# Patient Record
Sex: Male | Born: 1980 | Race: Black or African American | Hispanic: No
Health system: Southern US, Community
[De-identification: ages and names within clinical notes are randomized; demographics above are authoritative.]

---

## 1998-04-28 ENCOUNTER — Emergency Department (HOSPITAL_COMMUNITY): Admission: EM | Admit: 1998-04-28 | Discharge: 1998-04-28 | Payer: Self-pay | Admitting: Emergency Medicine

## 2006-03-06 ENCOUNTER — Emergency Department (HOSPITAL_COMMUNITY): Admission: EM | Admit: 2006-03-06 | Discharge: 2006-03-06 | Payer: Self-pay | Admitting: *Deleted

## 2006-03-15 ENCOUNTER — Emergency Department (HOSPITAL_COMMUNITY): Admission: EM | Admit: 2006-03-15 | Discharge: 2006-03-15 | Payer: Self-pay | Admitting: Emergency Medicine

## 2010-07-01 ENCOUNTER — Emergency Department (HOSPITAL_COMMUNITY): Admission: EM | Admit: 2010-07-01 | Discharge: 2010-07-01 | Payer: Self-pay | Admitting: Emergency Medicine

## 2011-11-06 ENCOUNTER — Emergency Department (HOSPITAL_COMMUNITY)
Admission: EM | Admit: 2011-11-06 | Discharge: 2011-11-06 | Disposition: A | Discharge status: Home / Self Care | Payer: Self-pay | Attending: Emergency Medicine | Admitting: Emergency Medicine

## 2011-11-06 ENCOUNTER — Encounter: Payer: Self-pay | Admitting: Emergency Medicine

## 2011-11-06 DIAGNOSIS — R112 Nausea with vomiting, unspecified: Secondary | ICD-10-CM | POA: Insufficient documentation

## 2011-11-06 DIAGNOSIS — F1010 Alcohol abuse, uncomplicated: Secondary | ICD-10-CM | POA: Insufficient documentation

## 2011-11-06 DIAGNOSIS — F10929 Alcohol use, unspecified with intoxication, unspecified: Secondary | ICD-10-CM

## 2011-11-06 MED ORDER — ONDANSETRON HCL 4 MG/2ML IJ SOLN
4.0000 mg | Freq: Once | INTRAMUSCULAR | Status: AC
  Administered 2011-11-06: 02:00:00 via INTRAVENOUS

## 2011-11-06 MED ORDER — PROMETHAZINE HCL 25 MG PO TABS: 25.0000 mg | ORAL_TABLET | Freq: Four times a day (QID) | ORAL | Status: AC | PRN

## 2011-11-06 MED ORDER — SODIUM CHLORIDE 0.9 % IV BOLUS (SEPSIS)
1000.0000 mL | Freq: Once | INTRAVENOUS | Status: AC
  Administered 2011-11-06: 1000 mL via INTRAVENOUS

## 2011-11-06 MED ORDER — ONDANSETRON HCL 4 MG/2ML IJ SOLN
INTRAMUSCULAR | Status: AC
  Filled 2011-11-06: qty 2

## 2011-11-06 NOTE — ED Notes (Signed)
See arrival info. 

## 2011-11-06 NOTE — ED Provider Notes (Signed)
History     CSN: [id number] Arrival date & time: 11/06/2011  1:54 AM   First MD Initiated Contact with Patient 11/06/11 [phone]      Chief Complaint  Patient presents with  . Alcohol Intoxication    HPI: Patient is a 30 y.o. male presenting with intoxication. The history is provided by the patient. No language interpreter was used.  Alcohol Intoxication This is a new problem. The current episode started today. The problem has been resolved. Associated symptoms include nausea and vomiting. He has tried nothing for the symptoms.  Alcohol Intoxication This is a new problem. The current episode started today. The problem has been resolved. He has tried nothing for the symptoms.  Pt admits to drinking mx shots of liquor tonight to celebrate his B-Day. He then began to "feel funny" and began to have N/V. Brought in by EMS for same. History reviewed. No pertinent past medical history.  History reviewed. No pertinent past surgical history.  History reviewed. No pertinent family history.  History  Substance Use Topics  . Smoking status: Current Everyday Smoker  . Smokeless tobacco: Not on file  . Alcohol Use: Yes     consumed tonight      Review of Systems  Constitutional: Negative.   HENT: Negative.   Eyes: Negative.   Respiratory: Negative.   Cardiovascular: Negative.   Gastrointestinal: Positive for nausea and vomiting.  Genitourinary: Negative.   Musculoskeletal: Negative.   Skin: Negative.   Neurological: Negative.   Hematological: Negative.   Psychiatric/Behavioral: Negative.     Allergies  Review of patient's allergies indicates no known allergies.  Home Medications  No current outpatient prescriptions on file.  BP 94/68  Pulse 60  Temp(Src) 97.6 F (36.4 C) (Oral)  Resp 18  SpO2 100%  Physical Exam  Constitutional: He is oriented to person, place, and time. He appears well-developed and well-nourished.  HENT:  Head: Normocephalic and atraumatic.  Eyes:  Pupils are equal, round, and reactive to light.  Neck: Neck supple.  Cardiovascular: Normal rate and regular rhythm.   Pulmonary/Chest: Effort normal and breath sounds normal.  Abdominal: Soft. Bowel sounds are normal.  Neurological: He is alert and oriented to person, place, and time.  Skin: Skin is warm and dry.  Psychiatric: He has a normal mood and affect.    ED Course  Procedures Pt feeling better after 2L NS and Zofran. No further vomiting. Mother at bedside and has agreed to take pt home and be responsible for his safety. Pt agreeable w/ plan  Labs Reviewed - No data to display No results found.   No diagnosis found.    MDM  N/V associated w/ alcohol intoxication.No further n/v. Pt feeling better. Mother here to take pt home. Will d/c home w/ medication for nausea if needed.   Medical screening examination/treatment/procedure(s) were performed by non-physician practitioner and as supervising physician I was immediately available for consultation/collaboration.      Roma Kayser Schorr, NP 11/06/11 [phone]  Sunnie Nielsen, MD 11/06/11 [phone]

## 2011-11-06 NOTE — ED Notes (Signed)
Patient stable and being discharged home with family.

## 2015-05-12 ENCOUNTER — Emergency Department (HOSPITAL_COMMUNITY)
Admission: EM | Admit: 2015-05-12 | Discharge: 2015-05-12 | Disposition: A | Discharge status: Home / Self Care | Payer: BLUE CROSS/BLUE SHIELD | Attending: Emergency Medicine | Admitting: Emergency Medicine

## 2015-05-12 ENCOUNTER — Encounter (HOSPITAL_COMMUNITY): Payer: Self-pay | Admitting: Emergency Medicine

## 2015-05-12 DIAGNOSIS — Z720 Tobacco use: Secondary | ICD-10-CM | POA: Diagnosis not present

## 2015-05-12 DIAGNOSIS — K0889 Other specified disorders of teeth and supporting structures: Secondary | ICD-10-CM

## 2015-05-12 DIAGNOSIS — K088 Other specified disorders of teeth and supporting structures: Secondary | ICD-10-CM | POA: Insufficient documentation

## 2015-05-12 MED ORDER — IBUPROFEN 800 MG PO TABS: 800.0000 mg | ORAL_TABLET | Freq: Three times a day (TID) | ORAL | Status: DC

## 2015-05-12 MED ORDER — AMOXICILLIN 500 MG PO CAPS: 500.0000 mg | ORAL_CAPSULE | Freq: Three times a day (TID) | ORAL | Status: DC

## 2015-05-12 NOTE — ED Notes (Signed)
Pt states that he woke up with right lower back knot in his mouth that is very painful.

## 2015-05-12 NOTE — ED Provider Notes (Signed)
CSN: [id number]     Arrival date & time 05/12/15  0732 History   First MD Initiated Contact with Patient 05/12/15 [phone]     Chief Complaint  Patient presents with  . Dental Pain     (Consider location/radiation/quality/duration/timing/severity/associated sxs/prior Treatment) Patient is a 34 y.o. male presenting with tooth pain. The history is provided by the patient.  Dental Pain Location:  Lower Lower teeth location:  32/RL 3rd molar Quality:  Aching and throbbing Severity:  Moderate Duration:  2 hours Timing:  Intermittent Progression:  Worsening Chronicity:  New Context: filling still in place, not recent dental surgery and not trauma   Relieved by:  Nothing Worsened by:  Nothing tried Ineffective treatments:  None tried Associated symptoms: no fever, no headaches, no neck pain and no oral bleeding   Risk factors: smoking   Risk factors: no immunosuppression     History reviewed. No pertinent past medical history. History reviewed. No pertinent past surgical history. No family history on file. History  Substance Use Topics  . Smoking status: Current Every Day Smoker  . Smokeless tobacco: Not on file  . Alcohol Use: Yes     Comment: consumed tonight    Review of Systems  Constitutional: Negative for fever and activity change.       All ROS Neg except as noted in HPI  HENT: Positive for dental problem. Negative for nosebleeds.   Eyes: Negative for photophobia and discharge.  Respiratory: Negative for cough, shortness of breath and wheezing.   Cardiovascular: Negative for chest pain and palpitations.  Gastrointestinal: Negative for abdominal pain and blood in stool.  Genitourinary: Negative for dysuria, frequency and hematuria.  Musculoskeletal: Negative for back pain, arthralgias and neck pain.  Skin: Negative.   Neurological: Negative for dizziness, seizures, speech difficulty and headaches.  Psychiatric/Behavioral: Negative for hallucinations and confusion.       Allergies  Review of patient's allergies indicates no known allergies.  Home Medications   Prior to Admission medications   Not on File   BP 115/78 mmHg  Pulse 64  Temp(Src) 98.1 F (36.7 C) (Oral)  Resp 16  SpO2 100% Physical Exam  Constitutional: He is oriented to person, place, and time. He appears well-developed and well-nourished.  Non-toxic appearance.  HENT:  Head: Normocephalic.  Right Ear: Tympanic membrane and external ear normal.  Left Ear: Tympanic membrane and external ear normal.  Mouth/Throat: Abnormal dentition.  There is swelling just behind the third molar on the lower right jaw area. There is no abscess. There is some swelling about the gum on. There appears to be some abnormality of abruption of the third molar. The airway is patent. There is no swelling under the tongue.  Eyes: EOM and lids are normal. Pupils are equal, round, and reactive to light.  Neck: Normal range of motion. Neck supple. Carotid bruit is not present.  Cardiovascular: Normal rate, regular rhythm, normal heart sounds, intact distal pulses and normal pulses.   Pulmonary/Chest: Breath sounds normal. No respiratory distress.  Abdominal: Soft. Bowel sounds are normal. There is no tenderness. There is no guarding.  Musculoskeletal: Normal range of motion.  Lymphadenopathy:       Head (right side): No submandibular adenopathy present.       Head (left side): No submandibular adenopathy present.    He has no cervical adenopathy.  Neurological: He is alert and oriented to person, place, and time. He has normal strength. No cranial nerve deficit or sensory deficit.  Skin: Skin  is warm and dry.  Psychiatric: He has a normal mood and affect. His speech is normal.  Nursing note and vitals reviewed.   ED Course  Procedures (including critical care time) Labs Review Labs Reviewed - No data to display  Imaging Review No results found.   EKG Interpretation None      MDM  Vital  signs are well within normal limits. Pulse oximetry is 100% on room air. Within normal limits by my interpretation. The patient has an abnormal interruption of the right lower third molar with some swelling about the gum on. The patient will be treated with amoxicillin and 800 mg ibuprofen. I strongly encouraged patient to see a dentist as soon as possible. He is also advised to see his primary physician or return to the emergency department if any changes, problems, or concerns.    Final diagnoses:  None    **I have reviewed nursing notes, vital signs, and all appropriate lab and imaging results for this patient.Ivery Quale*    Terrie Grajales, PA-C 05/12/15 [phone]  Toy CookeyMegan Docherty, MD 05/13/15 [phone]

## 2015-05-12 NOTE — Discharge Instructions (Signed)
Saltwater swishes maybe helpful. It is important that she see a dentist as sone as possible, as does seem to be some complication with the eruption of your third molar (wisdom tooth). Please use Amoxil 3 times daily with food. Please use ibuprofen 800 mg 3 times daily with food. Dental Pain A tooth ache may be caused by cavities (tooth decay). Cavities expose the nerve of the tooth to air and hot or cold temperatures. It may come from an infection or abscess (also called a boil or furuncle) around your tooth. It is also often caused by dental caries (tooth decay). This causes the pain you are having. DIAGNOSIS  Your caregiver can diagnose this problem by exam. TREATMENT   If caused by an infection, it may be treated with medications which kill germs (antibiotics) and pain medications as prescribed by your caregiver. Take medications as directed.  Only take over-the-counter or prescription medicines for pain, discomfort, or fever as directed by your caregiver.  Whether the tooth ache today is caused by infection or dental disease, you should see your dentist as soon as possible for further care. SEEK MEDICAL CARE IF: The exam and treatment you received today has been provided on an emergency basis only. This is not a substitute for complete medical or dental care. If your problem worsens or new problems (symptoms) appear, and you are unable to meet with your dentist, call or return to this location. SEEK IMMEDIATE MEDICAL CARE IF:   You have a fever.  You develop redness and swelling of your face, jaw, or neck.  You are unable to open your mouth.  You have severe pain uncontrolled by pain medicine. MAKE SURE YOU:   Understand these instructions.  Will watch your condition.  Will get help right away if you are not doing well or get worse. Document Released: 12/13/2005 Document Revised: 03/06/2012 Document Reviewed: 07/31/2008 [facility] Patient Information 2015 [employer], [place]. This  information is not intended to replace advice given to you by your health care provider. Make sure you discuss any questions you have with your health care provider.

## 2016-06-09 ENCOUNTER — Emergency Department (HOSPITAL_COMMUNITY): Payer: Self-pay

## 2016-06-09 ENCOUNTER — Encounter (HOSPITAL_COMMUNITY): Payer: Self-pay | Admitting: Emergency Medicine

## 2016-06-09 ENCOUNTER — Emergency Department (HOSPITAL_COMMUNITY)
Admission: EM | Admit: 2016-06-09 | Discharge: 2016-06-09 | Disposition: A | Discharge status: Home / Self Care | Payer: Self-pay | Attending: Emergency Medicine | Admitting: Emergency Medicine

## 2016-06-09 DIAGNOSIS — F17200 Nicotine dependence, unspecified, uncomplicated: Secondary | ICD-10-CM | POA: Insufficient documentation

## 2016-06-09 DIAGNOSIS — Z79899 Other long term (current) drug therapy: Secondary | ICD-10-CM | POA: Insufficient documentation

## 2016-06-09 DIAGNOSIS — R1032 Left lower quadrant pain: Secondary | ICD-10-CM | POA: Insufficient documentation

## 2016-06-09 DIAGNOSIS — R079 Chest pain, unspecified: Secondary | ICD-10-CM | POA: Insufficient documentation

## 2016-06-09 DIAGNOSIS — R51 Headache: Secondary | ICD-10-CM | POA: Insufficient documentation

## 2016-06-09 DIAGNOSIS — R1084 Generalized abdominal pain: Secondary | ICD-10-CM

## 2016-06-09 DIAGNOSIS — R1012 Left upper quadrant pain: Secondary | ICD-10-CM | POA: Insufficient documentation

## 2016-06-09 DIAGNOSIS — Z792 Long term (current) use of antibiotics: Secondary | ICD-10-CM | POA: Insufficient documentation

## 2016-06-09 LAB — CBC
HCT: 49.6 % (ref 39.0–52.0)
Hemoglobin: 16.9 g/dL (ref 13.0–17.0)
MCH: 28.3 pg (ref 26.0–34.0)
MCHC: 34.1 g/dL (ref 30.0–36.0)
MCV: 83.1 fL (ref 78.0–100.0)
Platelets: 254 10*3/uL (ref 150–400)
RBC: 5.97 MIL/uL — ABNORMAL HIGH (ref 4.22–5.81)
RDW: 13 % (ref 11.5–15.5)
WBC: 9.6 10*3/uL (ref 4.0–10.5)

## 2016-06-09 LAB — BASIC METABOLIC PANEL
Anion gap: 6 (ref 5–15)
BUN: 11 mg/dL (ref 6–20)
CO2: 29 mmol/L (ref 22–32)
Calcium: 9.2 mg/dL (ref 8.9–10.3)
Chloride: 106 mmol/L (ref 101–111)
Creatinine, Ser: 1.36 mg/dL — ABNORMAL HIGH (ref 0.61–1.24)
GFR calc Af Amer: >60 mL/min (ref >60)
GFR calc non Af Amer: >60 mL/min (ref >60)
Glucose, Bld: 91 mg/dL (ref 65–99)
Potassium: 4.1 mmol/L (ref 3.5–5.1)
Sodium: 141 mmol/L (ref 135–145)

## 2016-06-09 LAB — I-STAT TROPONIN, ED: Troponin i, poc: 0.01 ng/mL (ref 0.00–0.08)

## 2016-06-09 LAB — LIPASE, BLOOD: Lipase: 28 U/L (ref 11–51)

## 2016-06-09 MED ORDER — SODIUM CHLORIDE 0.9 % IV BOLUS (SEPSIS)
1000.0000 mL | Freq: Once | INTRAVENOUS | Status: AC
  Administered 2016-06-09: 1000 mL via INTRAVENOUS

## 2016-06-09 MED ORDER — FAMOTIDINE 20 MG PO TABS: 20.0000 mg | ORAL_TABLET | Freq: Two times a day (BID) | ORAL | Status: DC

## 2016-06-09 MED ORDER — FAMOTIDINE IN NACL 20-0.9 MG/50ML-% IV SOLN
20.0000 mg | Freq: Once | INTRAVENOUS | Status: AC
  Administered 2016-06-09: 20 mg via INTRAVENOUS
  Filled 2016-06-09: qty 50

## 2016-06-09 NOTE — ED Provider Notes (Signed)
CSN: [id number]     Arrival date & time 06/09/16  1227 History   First MD Initiated Contact with Patient 06/09/16 1555     Chief Complaint  Patient presents with  . Abdominal Pain  . Chest Pain  . Headache   PT SAID THAT HE HAS HAD ABD PAIN FOR THE PAST FEW WEEKS.  HE DENIES N/V/D OR FEVER.  THE PT TOLD THE NURSE THAT HE HAS HAD CP AND URI SX FOR THE PAST FEW DAYS, BUT DOES NOT MENTION THOSE TO ME.  PT DOES NOT HAVE PCP, SO HAS NOT SEEN THE DR FOR HIS SX.  PT HAS NOT TAKEN ANY OTC MEDS FOR SX.  (Consider location/radiation/quality/duration/timing/severity/associated sxs/prior Treatment) Patient is a 35 y.o. male presenting with abdominal pain, chest pain, and headaches. The history is provided by the patient.  Abdominal Pain Pain location:  LUQ and LLQ Pain quality: sharp   Pain radiates to:  Does not radiate Pain severity:  Mild Onset quality:  Gradual Timing:  Constant Progression:  Unchanged Chronicity:  New Relieved by:  Nothing Associated symptoms: chest pain   Chest Pain Associated symptoms: abdominal pain and headache   Headache Associated symptoms: abdominal pain     History reviewed. No pertinent past medical history. No past surgical history on file. No family history on file. Social History  Substance Use Topics  . Smoking status: Current Every Day Smoker  . Smokeless tobacco: None  . Alcohol Use: Yes     Comment: consumed tonight    Review of Systems  Cardiovascular: Positive for chest pain.  Gastrointestinal: Positive for abdominal pain.  Neurological: Positive for headaches.  All other systems reviewed and are negative.     Allergies  Review of patient's allergies indicates no known allergies.  Home Medications   Prior to Admission medications   Medication Sig Start Date End Date Taking? Authorizing Provider  Phenylephrine-DM-GG-APAP (TYLENOL COLD/FLU SEVERE) 5-10-200-325 MG TABS Take 1 tablet by mouth daily as needed (cold symptoms).   Yes  Historical Provider, MD  Phenylephrine-Pheniramine-DM [facility](THERAFLU COLD & COUGH PO) Take 1 packet by mouth daily as needed (cold symptoms).   Yes Historical Provider, MD  amoxicillin (AMOXIL) 500 MG capsule Take 1 capsule (500 mg total) by mouth 3 (three) times daily. Patient not taking: Reported on 06/09/2016 05/12/15   Ivery QualeHobson Bryant, PA-C  famotidine (PEPCID) 20 MG tablet Take 1 tablet (20 mg total) by mouth 2 (two) times daily. 06/09/16   Jacalyn LefevreJulie Jamisen Duerson, MD  ibuprofen (ADVIL,MOTRIN) 800 MG tablet Take 1 tablet (800 mg total) by mouth 3 (three) times daily. Patient not taking: Reported on 06/09/2016 05/12/15   Ivery QualeHobson Bryant, PA-C   BP 127/89 mmHg  Pulse 66  Temp(Src) 98.5 F (36.9 C) (Oral)  Resp 16  SpO2 100% Physical Exam  Constitutional: He is oriented to person, place, and time. He appears well-developed and well-nourished.  HENT:  Head: Normocephalic and atraumatic.  Right Ear: External ear normal.  Left Ear: External ear normal.  Nose: Nose normal.  Mouth/Throat: Oropharynx is clear and moist.  Eyes: Conjunctivae and EOM are normal. Pupils are equal, round, and reactive to light.  Neck: Normal range of motion. Neck supple.  Cardiovascular: Normal rate, regular rhythm, normal heart sounds and intact distal pulses.   Pulmonary/Chest: Effort normal and breath sounds normal.  Abdominal: Soft. Bowel sounds are normal. There is tenderness in the left lower quadrant.  Musculoskeletal: Normal range of motion.  Neurological: He is alert and oriented to person, place,  and time.  Skin: Skin is warm and dry.  Psychiatric: He has a normal mood and affect. His behavior is normal. Judgment and thought content normal.  Nursing note and vitals reviewed.   ED Course  Procedures (including critical care time) Labs Review Labs Reviewed  BASIC METABOLIC PANEL - Abnormal; Notable for the following:    Creatinine, Ser 1.36 (*)    All other components within normal limits  CBC - Abnormal; Notable  for the following:    RBC 5.97 (*)    All other components within normal limits  LIPASE, BLOOD  I-STAT TROPOININ, ED    Imaging Review Dg Chest 2 View  06/09/2016  CLINICAL DATA:  Chest pain for 1 month. EXAM: CHEST  2 VIEW COMPARISON:  None. FINDINGS: The heart size and mediastinal contours are within normal limits. Both lungs are clear. The visualized skeletal structures are unremarkable. IMPRESSION: No active cardiopulmonary disease. Electronically Signed   By: Elsie Stain M.D.   On: 06/09/2016 13:45   Dg Abd 1 View  06/09/2016  CLINICAL DATA:  abd pain greater on left x 2 weeks - pt states normal bowel movements EXAM: ABDOMEN - 1 VIEW COMPARISON:  None. FINDINGS: Normal bowel gas pattern. No abnormal abdominal calcifications. Regional bones unremarkable. IMPRESSION: Negative. Electronically Signed   By: Corlis Leak M.D.   On: 06/09/2016 16:45   I have personally reviewed and evaluated these images and lab results as part of my medical decision-making.   EKG Interpretation   Date/Time:  Wednesday June 09 2016 17:04:35 EDT Ventricular Rate:  64 PR Interval:  146 QRS Duration: 102 QT Interval:  379 QTC Calculation: 391 R Axis:   94 Text Interpretation:  Sinus rhythm Left posterior fascicular block ST  elev, probable normal early repol pattern Confirmed by Zakry Caso MD, [facility]  ([id number]) on 06/09/2016 5:14:32 PM      MDM  PT'S SX HAVE BEEN GOING ON FOR WEEKS.  NO FEVER, NO ELEVATED WBC.  EXAM IS BENIGN.  NO NEED FOR EMERGENT CT ABD/PELVIS.  PT GIVEN THE NUMBER OF GI AND INSTR TO F/U WITH THEM.  PT KNOWS TO RETURN HERE IF SX WORSEN.  Final diagnoses:  Generalized abdominal pain       Jacalyn Lefevre, MD 06/09/16 1724

## 2016-06-09 NOTE — Discharge Instructions (Signed)

## 2016-06-09 NOTE — ED Notes (Signed)
Pt states that he has been having "flu like symptoms", however, pt describes gen abd pain, CP, dizziness, headache, and URI sx since Monday.

## 2016-08-01 ENCOUNTER — Emergency Department (HOSPITAL_COMMUNITY)
Admission: EM | Admit: 2016-08-01 | Discharge: 2016-08-01 | Disposition: A | Discharge status: Home / Self Care | Payer: BLUE CROSS/BLUE SHIELD | Attending: Emergency Medicine | Admitting: Emergency Medicine

## 2016-08-01 DIAGNOSIS — Z791 Long term (current) use of non-steroidal anti-inflammatories (NSAID): Secondary | ICD-10-CM | POA: Insufficient documentation

## 2016-08-01 DIAGNOSIS — F17200 Nicotine dependence, unspecified, uncomplicated: Secondary | ICD-10-CM | POA: Insufficient documentation

## 2016-08-01 DIAGNOSIS — J40 Bronchitis, not specified as acute or chronic: Secondary | ICD-10-CM | POA: Insufficient documentation

## 2016-08-01 MED ORDER — AZITHROMYCIN 250 MG PO TABS: 250.0000 mg | ORAL_TABLET | Freq: Every day | ORAL | 0 refills | Status: DC

## 2016-08-01 NOTE — ED Triage Notes (Signed)
Pt states that he has had fever, runny nose, cough and his ears have been stopping up since last Thursday. Not febrile now. Took alka selzer plus cold at home. Alert and oriented.

## 2016-08-01 NOTE — ED Provider Notes (Signed)
[facility]-EMERGENCY DEPT Provider Note   CSN: [id number] Arrival date & time: 08/01/16  2041  First Provider Contact:  First MD Initiated Contact with Patient 08/01/16 2338      By signing my name below, I, Vista Mink, attest that this documentation has been prepared under the direction and in the presence of Messiah Ahr PA-C.  Electronically Signed: Vista Mink, ED Scribe. 08/02/16. 12:02 AM.   History   Chief Complaint Chief Complaint  Patient presents with  . URI    HPI HPI Comments: Christopher Rice is a 35 y.o. male who presents to the Emergency Department complaining of gradually worsening nasal congestion with associated chills and cough onset four days ago. Pt reports rhinorrhea and productive cough with phlegm. Pt further reports subjective fever since the onset of symptoms; not currently febrile on arrival to ED. Pt has taken Alka Seltzer Cold and Flu with no relief of symptoms. Pt states that his daughter was sick the week prior to onset of his symptoms. Reports no exacerbating or alleviating factors.     The history is provided by the patient. No language interpreter was used.    No past medical history on file.  There are no active problems to display for this patient.   No past surgical history on file.     Home Medications    Prior to Admission medications   Medication Sig Start Date End Date Taking? Authorizing Provider  amoxicillin (AMOXIL) 500 MG capsule Take 1 capsule (500 mg total) by mouth 3 (three) times daily. Patient not taking: Reported on 06/09/2016 05/12/15   Ivery Quale, PA-C  famotidine (PEPCID) 20 MG tablet Take 1 tablet (20 mg total) by mouth 2 (two) times daily. 06/09/16   Jacalyn Lefevre, MD  ibuprofen (ADVIL,MOTRIN) 800 MG tablet Take 1 tablet (800 mg total) by mouth 3 (three) times daily. Patient not taking: Reported on 06/09/2016 05/12/15   Ivery Quale, PA-C  Phenylephrine-DM-GG-APAP (TYLENOL COLD/FLU SEVERE) 5-10-200-325 MG TABS Take 1  tablet by mouth daily as needed (cold symptoms).    Historical Provider, MD  Phenylephrine-Pheniramine-DM [facility] COLD & COUGH PO) Take 1 packet by mouth daily as needed (cold symptoms).    Historical Provider, MD    Family History No family history on file.  Social History Social History  Substance Use Topics  . Smoking status: Current Every Day Smoker  . Smokeless tobacco: Not on file  . Alcohol use Yes     Comment: consumed tonight     Allergies   Review of patient's allergies indicates no known allergies.   Review of Systems Review of Systems  Constitutional: Positive for chills and fever (subjective).  HENT: Positive for congestion and rhinorrhea.   Respiratory: Positive for cough.   All other systems reviewed and are negative.    Physical Exam Updated Vital Signs BP 139/89 (BP Location: Right Arm)   Pulse 72   Temp 98.1 F (36.7 C) (Oral)   Resp 18   Ht 6' (1.829 m)   Wt 200 lb (90.7 kg)   SpO2 100%   BMI 27.12 kg/m   Physical Exam  Constitutional: He is oriented to person, place, and time. He appears well-developed and well-nourished. No distress.  HENT:  Head: Normocephalic and atraumatic.  Mouth/Throat: Oropharynx is clear and moist.  Bilateral ears clear. Nasal mucosa swollen and erythematous. Oropharynx benign. Anterior cervical lymphadenopathy.  Neck: Normal range of motion.  Cardiovascular: Normal rate and regular rhythm.   No murmur heard. Pulmonary/Chest: Effort normal.  He has no wheezes. He has no rales.  Diffuse, mild rhonchi.  Abdominal: There is no tenderness.  Musculoskeletal: Normal range of motion.  Neurological: He is alert and oriented to person, place, and time.  Skin: Skin is warm and dry. He is not diaphoretic.  Psychiatric: He has a normal mood and affect. Judgment normal.  Nursing note and vitals reviewed.    ED Treatments / Results  DIAGNOSTIC STUDIES: Oxygen Saturation is 100% on RA, normal by my  interpretation.  COORDINATION OF CARE: 11:41 PM-Will order ZPack. Discussed treatment plan with pt at bedside and pt agreed to plan.   Labs (all labs ordered are listed, but only abnormal results are displayed) Labs Reviewed - No data to display  EKG  EKG Interpretation None       Radiology No results found.  Procedures Procedures (including critical care time)  Medications Ordered in ED Medications - No data to display   Initial Impression / Assessment and Plan / ED Course  I have reviewed the triage vital signs and the nursing notes.  Pertinent labs & imaging results that were available during my care of the patient were reviewed by me and considered in my medical decision making (see chart for details).  Clinical Course    Patient with complaint of URI symptoms including cough. Exam supports viral vs bronchitis/sinusitis. Will cover with Z-pack and encourage PCP follow up.  Final Clinical Impressions(s) / ED Diagnoses   Final diagnoses:  None  1. bronchitis  New Prescriptions New Prescriptions   No medications on file  I personally performed the services described in this documentation, which was scribed in my presence. The recorded information has been reviewed and is accurate.     Elpidio AnisShari Makiya Jeune, PA-C 08/07/16 2023    Lyndal Pulleyaniel Knott, MD 08/12/16 [phone]

## 2017-06-03 ENCOUNTER — Encounter (HOSPITAL_COMMUNITY): Payer: Self-pay | Admitting: Emergency Medicine

## 2017-06-03 ENCOUNTER — Emergency Department (HOSPITAL_COMMUNITY)
Admission: EM | Admit: 2017-06-03 | Discharge: 2017-06-03 | Disposition: A | Discharge status: Home / Self Care | Payer: BLUE CROSS/BLUE SHIELD | Attending: Emergency Medicine | Admitting: Emergency Medicine

## 2017-06-03 DIAGNOSIS — F17200 Nicotine dependence, unspecified, uncomplicated: Secondary | ICD-10-CM | POA: Insufficient documentation

## 2017-06-03 DIAGNOSIS — Y939 Activity, unspecified: Secondary | ICD-10-CM | POA: Insufficient documentation

## 2017-06-03 DIAGNOSIS — M545 Low back pain, unspecified: Secondary | ICD-10-CM

## 2017-06-03 DIAGNOSIS — X500XXA Overexertion from strenuous movement or load, initial encounter: Secondary | ICD-10-CM | POA: Insufficient documentation

## 2017-06-03 DIAGNOSIS — Y999 Unspecified external cause status: Secondary | ICD-10-CM | POA: Insufficient documentation

## 2017-06-03 DIAGNOSIS — Y92007 Garden or yard of unspecified non-institutional (private) residence as the place of occurrence of the external cause: Secondary | ICD-10-CM | POA: Insufficient documentation

## 2017-06-03 DIAGNOSIS — Z79899 Other long term (current) drug therapy: Secondary | ICD-10-CM | POA: Insufficient documentation

## 2017-06-03 MED ORDER — NAPROXEN 500 MG PO TABS: 500.0000 mg | ORAL_TABLET | Freq: Two times a day (BID) | ORAL | 0 refills | Status: DC

## 2017-06-03 MED ORDER — OXYCODONE-ACETAMINOPHEN 5-325 MG PO TABS
1.0000 | ORAL_TABLET | ORAL | 0 refills | Status: DC | PRN
Start: 2017-06-03 — End: 2023-04-23

## 2017-06-03 MED ORDER — CYCLOBENZAPRINE HCL 10 MG PO TABS
10.0000 mg | ORAL_TABLET | Freq: Once | ORAL | Status: AC
Start: 2017-06-03 — End: 2017-06-03
  Administered 2017-06-03: 10 mg via ORAL
  Filled 2017-06-03: qty 1

## 2017-06-03 MED ORDER — ORPHENADRINE CITRATE ER 100 MG PO TB12: 100.0000 mg | ORAL_TABLET | Freq: Two times a day (BID) | ORAL | 0 refills | Status: DC

## 2017-06-03 MED ORDER — IBUPROFEN 800 MG PO TABS
800.0000 mg | ORAL_TABLET | Freq: Once | ORAL | Status: AC
  Administered 2017-06-03: 800 mg via ORAL
  Filled 2017-06-03: qty 1

## 2017-06-03 NOTE — ED Provider Notes (Signed)
[facility]-EMERGENCY DEPT Provider Note   CSN: [id number] Arrival date & time: 06/03/17  0226   By signing my name below, I, Clarisse GougeXavier Herndon, attest that this documentation has been prepared under the direction and in the presence of Dione BoozeGlick, Ayaz Sondgeroth, MD. Electronically signed, Clarisse GougeXavier Herndon, ED Scribe. 06/03/17. 3:19 AM.   History   Chief Complaint Chief Complaint  Patient presents with  . Back Pain   The history is provided by the patient and medical records. No language interpreter was used.    Christopher Rice is a 36 y.o. male presenting to the Emergency Department with chief complaint of recurrent, worsening pain across the lower back x "a couple days". Pt describes 10/10, sharp, throbbing, burning pain in the low back that is worse with laying flat on his back. He states the pain radiates down both thighs with a tingling, pins & needle sensation to the lower extremities. He also notes mild relief to pain with hamstring stretching. Pt states he has taken tylenol, heat and ice without relief. No other modifying factors noted. He notes h/o similar, less severe symptoms that have been relieved by heat and muscle relaxants. He states he recently did more bending, lifting and twisting during yard work. No PCP noted for F/U. No weakness or numbness noted. No other complaints at this time.   History reviewed. No pertinent past medical history.  There are no active problems to display for this patient.   History reviewed. No pertinent surgical history.     Home Medications    Prior to Admission medications   Medication Sig Start Date End Date Taking? Authorizing Provider  amoxicillin (AMOXIL) 500 MG capsule Take 1 capsule (500 mg total) by mouth 3 (three) times daily. Patient not taking: Reported on 06/09/2016 05/12/15   Ivery QualeBryant, Hobson, PA-C  azithromycin (ZITHROMAX) 250 MG tablet Take 1 tablet (250 mg total) by mouth daily. Take first 2 tablets together, then 1 every day until finished. 08/01/16    Elpidio AnisUpstill, Shari, PA-C  famotidine (PEPCID) 20 MG tablet Take 1 tablet (20 mg total) by mouth 2 (two) times daily. 06/09/16   Jacalyn LefevreHaviland, Julie, MD  ibuprofen (ADVIL,MOTRIN) 800 MG tablet Take 1 tablet (800 mg total) by mouth 3 (three) times daily. Patient not taking: Reported on 06/09/2016 05/12/15   Ivery QualeBryant, Hobson, PA-C  Phenylephrine-DM-GG-APAP (TYLENOL COLD/FLU SEVERE) 5-10-200-325 MG TABS Take 1 tablet by mouth daily as needed (cold symptoms).    [provider]  Phenylephrine-Pheniramine-DM [facility](THERAFLU COLD & COUGH PO) Take 1 packet by mouth daily as needed (cold symptoms).    [provider]    Family History History reviewed. No pertinent family history.  Social History Social History  Substance Use Topics  . Smoking status: Current Every Day Smoker  . Smokeless tobacco: Never Used  . Alcohol use Yes     Comment: consumed tonight     Allergies   Patient has no known allergies.   Review of Systems Review of Systems  Musculoskeletal: Positive for back pain and myalgias.  Skin: Negative for color change and wound.  Neurological: Negative for weakness and numbness.  All other systems reviewed and are negative.    Physical Exam Updated Vital Signs BP 118/87 (BP Location: Left Arm)   Pulse 80   Temp 97.7 F (36.5 C) (Oral)   Resp 18   Ht 6' (1.829 m)   Wt 198 lb (89.8 kg)   SpO2 99%   BMI 26.85 kg/m   Physical Exam  Constitutional: He is  oriented to person, place, and time. He appears well-developed and well-nourished.  HENT:  Head: Normocephalic and atraumatic.  Eyes: EOM are normal. Pupils are equal, round, and reactive to light.  Neck: Normal range of motion. Neck supple. No JVD present.  Cardiovascular: Normal rate, regular rhythm and normal heart sounds.   No murmur heard. Pulmonary/Chest: Effort normal and breath sounds normal. He has no wheezes. He has no rales. He exhibits no tenderness.  Abdominal: Soft. Bowel sounds are normal. He exhibits  no distension and no mass. There is no tenderness.  Musculoskeletal: Normal range of motion. He exhibits tenderness. He exhibits no edema.  Mild tenderness lumbar spine. Moderate bilateral paralumbar spasms L > R. Straight leg test negative.  Lymphadenopathy:    He has no cervical adenopathy.  Neurological: He is alert and oriented to person, place, and time. No cranial nerve deficit. He exhibits normal muscle tone. Coordination normal.  Skin: Skin is warm and dry. No rash noted.  Psychiatric: He has a normal mood and affect. His behavior is normal. Judgment and thought content normal.  Nursing note and vitals reviewed.    ED Treatments / Results  DIAGNOSTIC STUDIES: Oxygen Saturation is 99% on RA, NL by my interpretation.    COORDINATION OF CARE: 3:16 AM-Discussed next steps with pt. Pt verbalized understanding and is agreeable with the plan. Will Rx medications and provide resources for primary care F/U.  Procedures Procedures (including critical care time)  Medications Ordered in ED Medications  ibuprofen (ADVIL,MOTRIN) tablet 800 mg (not administered)  cyclobenzaprine (FLEXERIL) tablet 10 mg (not administered)     Initial Impression / Assessment and Plan / ED Course  I have reviewed the triage vital signs and the nursing notes.   Acute low back pain. No red flags to suggest serious causes of back pain. This appears to be musculoskeletal in origin. No indications for imaging. Old records reviewed, and he has no relevant past visits. He is discharged with prescriptions for naproxen, orphenadrine, and oxycodone-acetaminophen.  Final Clinical Impressions(s) / ED Diagnoses   Final diagnoses:  Acute bilateral low back pain without sciatica    New Prescriptions New Prescriptions   NAPROXEN (NAPROSYN) 500 MG TABLET    Take 1 tablet (500 mg total) by mouth 2 (two) times daily.   ORPHENADRINE (NORFLEX) 100 MG TABLET    Take 1 tablet (100 mg total) by mouth 2 (two) times daily.     OXYCODONE-ACETAMINOPHEN (PERCOCET) 5-325 MG TABLET    Take 1 tablet by mouth every 4 (four) hours as needed for moderate pain.   I personally performed the services described in this documentation, which was scribed in my presence. The recorded information has been reviewed and is accurate.       Dione Booze, MD 06/03/17 [phone]

## 2017-06-03 NOTE — ED Triage Notes (Signed)
Patient complaining about lower back pain that has been there since Monday. Patient does not know how he hurt his back. Both of his legs hurt when he lay down. Patient states the pain is so bad he can not sleep.

## 2020-09-09 ENCOUNTER — Other Ambulatory Visit: Payer: Self-pay

## 2020-09-09 DIAGNOSIS — Y9389 Activity, other specified: Secondary | ICD-10-CM | POA: Insufficient documentation

## 2020-09-09 DIAGNOSIS — M79602 Pain in left arm: Secondary | ICD-10-CM | POA: Diagnosis not present

## 2020-09-09 DIAGNOSIS — F17200 Nicotine dependence, unspecified, uncomplicated: Secondary | ICD-10-CM | POA: Diagnosis not present

## 2020-09-09 DIAGNOSIS — Z23 Encounter for immunization: Secondary | ICD-10-CM | POA: Diagnosis not present

## 2020-09-09 DIAGNOSIS — M25512 Pain in left shoulder: Secondary | ICD-10-CM | POA: Diagnosis not present

## 2020-09-09 DIAGNOSIS — Y998 Other external cause status: Secondary | ICD-10-CM | POA: Diagnosis not present

## 2020-09-09 DIAGNOSIS — Y92410 Unspecified street and highway as the place of occurrence of the external cause: Secondary | ICD-10-CM | POA: Diagnosis not present

## 2020-09-09 DIAGNOSIS — Z79899 Other long term (current) drug therapy: Secondary | ICD-10-CM | POA: Diagnosis not present

## 2020-09-10 ENCOUNTER — Emergency Department (HOSPITAL_COMMUNITY)
Admission: EM | Admit: 2020-09-10 | Discharge: 2020-09-10 | Disposition: A | Discharge status: Home / Self Care | Payer: No Typology Code available for payment source | Attending: Emergency Medicine | Admitting: Emergency Medicine

## 2020-09-10 ENCOUNTER — Emergency Department (HOSPITAL_COMMUNITY): Payer: No Typology Code available for payment source

## 2020-09-10 ENCOUNTER — Other Ambulatory Visit: Payer: Self-pay

## 2020-09-10 LAB — COMPREHENSIVE METABOLIC PANEL
ALT: 47 U/L — ABNORMAL HIGH (ref 0–44)
AST: 41 U/L (ref 15–41)
Albumin: 4.8 g/dL (ref 3.5–5.0)
Alkaline Phosphatase: 67 U/L (ref 38–126)
Anion gap: 15 (ref 5–15)
BUN: 12 mg/dL (ref 6–20)
CO2: 22 mmol/L (ref 22–32)
Calcium: 9 mg/dL (ref 8.9–10.3)
Chloride: 105 mmol/L (ref 98–111)
Creatinine, Ser: 1.24 mg/dL (ref 0.61–1.24)
GFR calc Af Amer: >60 mL/min (ref >60)
GFR calc non Af Amer: >60 mL/min (ref >60)
Glucose, Bld: 92 mg/dL (ref 70–99)
Potassium: 3.7 mmol/L (ref 3.5–5.1)
Sodium: 142 mmol/L (ref 135–145)
Total Bilirubin: 2.6 mg/dL — ABNORMAL HIGH (ref 0.3–1.2)
Total Protein: 7.9 g/dL (ref 6.5–8.1)

## 2020-09-10 LAB — URINALYSIS, ROUTINE W REFLEX MICROSCOPIC
Bilirubin Urine: NEGATIVE
Glucose, UA: NEGATIVE mg/dL
Ketones, ur: 5 mg/dL — AB
Leukocytes,Ua: NEGATIVE
Nitrite: NEGATIVE
Protein, ur: NEGATIVE mg/dL
Specific Gravity, Urine: 1.017 (ref 1.005–1.030)
pH: 5 (ref 5.0–8.0)

## 2020-09-10 LAB — PROTIME-INR
INR: 1.1 (ref 0.8–1.2)
Prothrombin Time: 14.2 seconds (ref 11.4–15.2)

## 2020-09-10 LAB — LACTIC ACID, PLASMA: Lactic Acid, Venous: 2.2 mmol/L (ref 0.5–1.9)

## 2020-09-10 LAB — CBC
HCT: 54 % — ABNORMAL HIGH (ref 39.0–52.0)
Hemoglobin: 17.8 g/dL — ABNORMAL HIGH (ref 13.0–17.0)
MCH: 30.1 pg (ref 26.0–34.0)
MCHC: 33 g/dL (ref 30.0–36.0)
MCV: 91.4 fL (ref 80.0–100.0)
Platelets: 235 10*3/uL (ref 150–400)
RBC: 5.91 MIL/uL — ABNORMAL HIGH (ref 4.22–5.81)
RDW: 12.7 % (ref 11.5–15.5)
WBC: 9.4 10*3/uL (ref 4.0–10.5)
nRBC: 0 % (ref 0.0–0.2)

## 2020-09-10 LAB — SAMPLE TO BLOOD BANK

## 2020-09-10 LAB — ETHANOL: Alcohol, Ethyl (B): 113 mg/dL — ABNORMAL HIGH (ref <10)

## 2020-09-10 MED ORDER — IBUPROFEN 800 MG PO TABS: 800.0000 mg | ORAL_TABLET | Freq: Three times a day (TID) | ORAL | 0 refills | Status: DC

## 2020-09-10 MED ORDER — TETANUS-DIPHTH-ACELL PERTUSSIS 5-2.5-18.5 LF-MCG/0.5 IM SUSP
0.5000 mL | Freq: Once | INTRAMUSCULAR | Status: AC
  Administered 2020-09-10: 0.5 mL via INTRAMUSCULAR
  Filled 2020-09-10: qty 0.5

## 2020-09-10 MED ORDER — LACTATED RINGERS IV BOLUS
1000.0000 mL | Freq: Once | INTRAVENOUS | Status: AC
  Administered 2020-09-10: 1000 mL via INTRAVENOUS

## 2020-09-10 MED ORDER — BACITRACIN ZINC 500 UNIT/GM EX OINT: 1.0000 "application " | TOPICAL_OINTMENT | Freq: Two times a day (BID) | CUTANEOUS | Status: DC

## 2020-09-10 MED ORDER — IOHEXOL 300 MG/ML  SOLN
100.0000 mL | Freq: Once | INTRAMUSCULAR | Status: AC | PRN
  Administered 2020-09-10: 100 mL via INTRAVENOUS

## 2020-09-10 MED ORDER — CYCLOBENZAPRINE HCL 10 MG PO TABS: 10.0000 mg | ORAL_TABLET | Freq: Two times a day (BID) | ORAL | 0 refills | Status: DC | PRN

## 2020-09-10 MED ORDER — FENTANYL CITRATE (PF) 100 MCG/2ML IJ SOLN
50.0000 ug | Freq: Once | INTRAMUSCULAR | Status: AC
  Administered 2020-09-10: 50 ug via INTRAVENOUS
  Filled 2020-09-10: qty 2
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
## 2021-09-22 ENCOUNTER — Emergency Department (HOSPITAL_COMMUNITY): Payer: No Typology Code available for payment source

## 2021-09-22 ENCOUNTER — Other Ambulatory Visit: Payer: Self-pay

## 2021-09-22 ENCOUNTER — Emergency Department (HOSPITAL_COMMUNITY)
Admission: EM | Admit: 2021-09-22 | Discharge: 2021-09-23 | Disposition: A | Discharge status: Home / Self Care | Payer: No Typology Code available for payment source | Attending: Emergency Medicine | Admitting: Emergency Medicine

## 2021-09-22 ENCOUNTER — Encounter (HOSPITAL_COMMUNITY): Payer: Self-pay

## 2021-09-22 DIAGNOSIS — T1490XA Injury, unspecified, initial encounter: Secondary | ICD-10-CM

## 2021-09-22 DIAGNOSIS — Z23 Encounter for immunization: Secondary | ICD-10-CM | POA: Diagnosis not present

## 2021-09-22 DIAGNOSIS — F10920 Alcohol use, unspecified with intoxication, uncomplicated: Secondary | ICD-10-CM

## 2021-09-22 DIAGNOSIS — S161XXA Strain of muscle, fascia and tendon at neck level, initial encounter: Secondary | ICD-10-CM | POA: Insufficient documentation

## 2021-09-22 DIAGNOSIS — Y908 Blood alcohol level of 240 mg/100 ml or more: Secondary | ICD-10-CM | POA: Insufficient documentation

## 2021-09-22 DIAGNOSIS — K7689 Other specified diseases of liver: Secondary | ICD-10-CM | POA: Diagnosis not present

## 2021-09-22 DIAGNOSIS — S01111A Laceration without foreign body of right eyelid and periocular area, initial encounter: Secondary | ICD-10-CM | POA: Insufficient documentation

## 2021-09-22 DIAGNOSIS — E860 Dehydration: Secondary | ICD-10-CM | POA: Insufficient documentation

## 2021-09-22 DIAGNOSIS — S0993XA Unspecified injury of face, initial encounter: Secondary | ICD-10-CM | POA: Diagnosis present

## 2021-09-22 DIAGNOSIS — S060X9A Concussion with loss of consciousness of unspecified duration, initial encounter: Secondary | ICD-10-CM | POA: Insufficient documentation

## 2021-09-22 DIAGNOSIS — F10929 Alcohol use, unspecified with intoxication, unspecified: Secondary | ICD-10-CM | POA: Insufficient documentation

## 2021-09-22 DIAGNOSIS — S0083XA Contusion of other part of head, initial encounter: Secondary | ICD-10-CM

## 2021-09-22 MED ORDER — TETANUS-DIPHTH-ACELL PERTUSSIS 5-2.5-18.5 LF-MCG/0.5 IM SUSY
0.5000 mL | PREFILLED_SYRINGE | Freq: Once | INTRAMUSCULAR | Status: AC
  Administered 2021-09-22: 0.5 mL via INTRAMUSCULAR
  Filled 2021-09-22: qty 0.5
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
## 2021-09-22 NOTE — ED Notes (Signed)
CBG 129 

## 2021-09-23 LAB — COMPREHENSIVE METABOLIC PANEL
ALT: 77 U/L — ABNORMAL HIGH (ref 0–44)
AST: 57 U/L — ABNORMAL HIGH (ref 15–41)
Albumin: 4.5 g/dL (ref 3.5–5.0)
Alkaline Phosphatase: 77 U/L (ref 38–126)
Anion gap: 14 (ref 5–15)
BUN: 10 mg/dL (ref 6–20)
CO2: 18 mmol/L — ABNORMAL LOW (ref 22–32)
Calcium: 9.2 mg/dL (ref 8.9–10.3)
Chloride: 106 mmol/L (ref 98–111)
Creatinine, Ser: 1.32 mg/dL — ABNORMAL HIGH (ref 0.61–1.24)
GFR, Estimated: >60 mL/min (ref >60)
Glucose, Bld: 100 mg/dL — ABNORMAL HIGH (ref 70–99)
Potassium: 3.7 mmol/L (ref 3.5–5.1)
Sodium: 138 mmol/L (ref 135–145)
Total Bilirubin: 2.9 mg/dL — ABNORMAL HIGH (ref 0.3–1.2)
Total Protein: 7.8 g/dL (ref 6.5–8.1)

## 2021-09-23 LAB — CBC
HCT: 57.8 % — ABNORMAL HIGH (ref 39.0–52.0)
Hemoglobin: 19.6 g/dL — ABNORMAL HIGH (ref 13.0–17.0)
MCH: 30.5 pg (ref 26.0–34.0)
MCHC: 33.9 g/dL (ref 30.0–36.0)
MCV: 90 fL (ref 80.0–100.0)
Platelets: 251 10*3/uL (ref 150–400)
RBC: 6.42 MIL/uL — ABNORMAL HIGH (ref 4.22–5.81)
RDW: 12.7 % (ref 11.5–15.5)
WBC: 8.5 10*3/uL (ref 4.0–10.5)
nRBC: 0 % (ref 0.0–0.2)

## 2021-09-23 LAB — ETHANOL: Alcohol, Ethyl (B): 242 mg/dL — ABNORMAL HIGH (ref <10)

## 2021-09-23 LAB — I-STAT CHEM 8, ED
BUN: 12 mg/dL (ref 6–20)
Calcium, Ion: 0.92 mmol/L — ABNORMAL LOW (ref 1.15–1.40)
Chloride: 108 mmol/L (ref 98–111)
Creatinine, Ser: 1.6 mg/dL — ABNORMAL HIGH (ref 0.61–1.24)
Glucose, Bld: 102 mg/dL — ABNORMAL HIGH (ref 70–99)
HCT: 58 % — ABNORMAL HIGH (ref 39.0–52.0)
Hemoglobin: 19.7 g/dL — ABNORMAL HIGH (ref 13.0–17.0)
Potassium: 3.7 mmol/L (ref 3.5–5.1)
Sodium: 141 mmol/L (ref 135–145)
TCO2: 18 mmol/L — ABNORMAL LOW (ref 22–32)

## 2021-09-23 LAB — CBG MONITORING, ED: Glucose-Capillary: 129 mg/dL — ABNORMAL HIGH (ref 70–99)

## 2021-09-23 MED ORDER — LACTATED RINGERS IV BOLUS
2000.0000 mL | Freq: Once | INTRAVENOUS | Status: AC
  Administered 2021-09-23: 2000 mL via INTRAVENOUS

## 2021-09-23 NOTE — Discharge Instructions (Addendum)
Substance Abuse Treatment Programs ° °Intensive Outpatient Programs °[facility]     °[address]      °[place], [place]                   °[phone]      ° °[facility] °[address] °[place], [place] °336-379-7146 ° °[facility]     °(Inpatient and outpatient)     °[address]           °[phone]   ° °[facility] °336-288-1484 (Suboxone and Methadone) ° °[address]      °[place], [place] [address]      °336-882-2125      ° °[address] °[place], [place] °852-3033 ° °[facility] (Outpatient/Inpatient, Chemical)    °(insurance only) [phone]      °       °[facility] (Groups & Residential) °[place], [place] °336-389-1413 ° °   °[facility]     °[address]     °[place], [place]      °[phone]      ° °[facility] (for caregivers and family) °[address] °[place], [place] °336-299-4655 ° ° ° ° ° °Residential Treatment Programs °[facility]      °[address], [place], [place] [address]  °([phone]      ° °[facility] °[address] [place], [place] [address] °[phone] ° °[facility]        °336-248-8914      ° °[facility] °[phone] ° °[facility]             °[address]                                         °[place], [place]                                                °877-615-2722 or 336-784-9470                              ° °[facility] [place] °[address] °[place], [address] °[phone] ° °[facility]    °[address]      °[place], [place]     °[phone]      ° °[facility] °[address] °[place], [place] °336-285-9073 ° °[facility]   °[address]     °[place], [place] [address]     °336-899-1550      °Admissions: 8am-3pm M-F ° °[facility] °[address] °[place] °336-227-7417 ° °[facility]: Residential Program (90 Days)   °[place], [place]      °336-725-8389 or 800-758-6077    ° °[facility] °[place],  °(Walk in Hours over the weekend or by referral) ° °[facility] °[address], [place],  [address] °([phone] ° °Crisis Mobile: [facility]:  1-877-626-1772 (for crisis response 24 hours a day) °[facility] Hotline:      1-800-256-2452 °Outpatient Psychiatry and Counseling ° °[facility]: Mobile Crisis   Management 24 hours:  1-877-626-1772 ° °[facility] sliding scale fee and walk in schedule: M-F 8am-12pm/1pm-3pm °[address]  °[place], [place] [address] °336-387-6161 ° °[facility] °[address] °[place], [place] [address] °336-703-9650 ° °[facility] ([facility])- new patient walk-in appointments available Monday - Friday 8am -3pm.          °[address] °[place], [place] [address] °336-676-6840 or crisis line- 336-676-6905 ° °[facility] Intensive Outpatient Therapy Program °[address] °[place], [place] [address] °336-832-9804 ° °[facility]                  °Crisis Services      °[phone]      °[address]     °[place], [place] [address]                ° °[facility]   °[facility] °[phone] °[address] °[place], [place] [address] ° ° °[facility]          °[address],  °[place], [place] [address]       °([phone] ° °[facility] °[address] °[place], [place] [address] °336-292-1510 ° °[facility]    °[address]    °[place], [place] [address]     °[phone]      ° °Parish McKinney, MD     °[address]     °[place] [address]     °336-282-1251     °  °[facility] °[address] °[place] [address] ° °[facility]     °[address]     °[place], [place]      °336-542-2076      ° °[facility] °Shamsher Ahluwalia, MD °[address] °[place], [place] [address] °336-420-9558 ° °[facility]     °[address]     °[place], [place] [address]     °336-274-1237      ° °[facility] °[address] °[place], [place] [address] °336-854-4450 °Resources for Temporary Residential Assistance/Crisis Centers ° °DAY CENTERS °[facility] °M-F 8am-3pm   °[address] [place], [place] [address]   336-332-0824 °Services include: laundry, barbering, support groups, case management, phone  & computer access, showers, AA/NA mtgs, mental health/substance abuse nurse, job skills class, disability information, VA assistance, spiritual classes, etc.  ° °HOMELESS SHELTERS ° °[facility]     °[facility]   °[address], [place]     °336.271.5959       °       °[facility] (women and children)       °[address] °[place], [place] [address] °336-275-0820 °[email or web address] for application and process °Application Required ° °[facility]   °[address]    °[place]  [address]     °[phone]       °             °[facility] °[address] °,  [address] °336.273.5572 °336-235-0363(schedule application appt.) °Application Required ° °[facility] (women only)    °[address]     °[place],  [address]     °336-884-1039      °  Intake starts 6pm daily °Need valid ID, SSC, & Police report °[facility] [place] °[address] °[place], [place] °336-881-5420 °Application Required ° °[facility] (men only)     °[address]      °[place], [place]     °[phone]      ° °[facility] °(Pregnant women only) °[address] °[place], [place] °336-275-0206 ° °[facility]      °[address]      °[place], [place] [address]     °[phone]      °       °[facility] °[address] °[place], [place] °336-723-1848 °90 day commitment/SA/Application process ° °[facility](men only)     °[address]     °[place], [place]     °[phone]       °Check-in at 7pm     °       °[facility] °[address] °[place], [place] [address] °336-248-6684 °Men/Women/Women and Children must be there by 7 pm ° °[facility] °[place], [place] °336-722-8721                ° °

## 2021-12-25 ENCOUNTER — Emergency Department (HOSPITAL_COMMUNITY): Payer: Self-pay

## 2021-12-25 ENCOUNTER — Other Ambulatory Visit: Payer: Self-pay

## 2021-12-25 ENCOUNTER — Emergency Department (HOSPITAL_COMMUNITY)
Admission: EM | Admit: 2021-12-25 | Discharge: 2021-12-25 | Disposition: A | Discharge status: Home / Self Care | Payer: Self-pay | Attending: Emergency Medicine | Admitting: Emergency Medicine

## 2021-12-25 DIAGNOSIS — Y902 Blood alcohol level of 40-59 mg/100 ml: Secondary | ICD-10-CM | POA: Insufficient documentation

## 2021-12-25 DIAGNOSIS — S0181XA Laceration without foreign body of other part of head, initial encounter: Secondary | ICD-10-CM | POA: Insufficient documentation

## 2021-12-25 DIAGNOSIS — M542 Cervicalgia: Secondary | ICD-10-CM | POA: Insufficient documentation

## 2021-12-25 DIAGNOSIS — R000 Tachycardia, unspecified: Secondary | ICD-10-CM | POA: Insufficient documentation

## 2021-12-25 DIAGNOSIS — Z23 Encounter for immunization: Secondary | ICD-10-CM | POA: Insufficient documentation

## 2021-12-25 DIAGNOSIS — R531 Weakness: Secondary | ICD-10-CM | POA: Insufficient documentation

## 2021-12-25 DIAGNOSIS — Z20822 Contact with and (suspected) exposure to covid-19: Secondary | ICD-10-CM | POA: Insufficient documentation

## 2021-12-25 DIAGNOSIS — S0990XA Unspecified injury of head, initial encounter: Secondary | ICD-10-CM

## 2021-12-25 DIAGNOSIS — S61412A Laceration without foreign body of left hand, initial encounter: Secondary | ICD-10-CM | POA: Insufficient documentation

## 2021-12-25 LAB — CBC WITH DIFFERENTIAL/PLATELET
Abs Immature Granulocytes: 0.03 10*3/uL (ref 0.00–0.07)
Basophils Absolute: 0 10*3/uL (ref 0.0–0.1)
Basophils Relative: 0 %
Eosinophils Absolute: 0.1 10*3/uL (ref 0.0–0.5)
Eosinophils Relative: 1 %
HCT: 54.7 % — ABNORMAL HIGH (ref 39.0–52.0)
Hemoglobin: 17.9 g/dL — ABNORMAL HIGH (ref 13.0–17.0)
Immature Granulocytes: 0 %
Lymphocytes Relative: 13 %
Lymphs Abs: 1.3 10*3/uL (ref 0.7–4.0)
MCH: 30.4 pg (ref 26.0–34.0)
MCHC: 32.7 g/dL (ref 30.0–36.0)
MCV: 92.9 fL (ref 80.0–100.0)
Monocytes Absolute: 0.3 10*3/uL (ref 0.1–1.0)
Monocytes Relative: 3 %
Neutro Abs: 7.8 10*3/uL — ABNORMAL HIGH (ref 1.7–7.7)
Neutrophils Relative %: 83 %
Platelets: 235 10*3/uL (ref 150–400)
RBC: 5.89 MIL/uL — ABNORMAL HIGH (ref 4.22–5.81)
RDW: 12.8 % (ref 11.5–15.5)
WBC: 9.5 10*3/uL (ref 4.0–10.5)
nRBC: 0 % (ref 0.0–0.2)

## 2021-12-25 LAB — COMPREHENSIVE METABOLIC PANEL
ALT: 19 U/L (ref 0–44)
AST: 21 U/L (ref 15–41)
Albumin: 3.8 g/dL (ref 3.5–5.0)
Alkaline Phosphatase: 68 U/L (ref 38–126)
Anion gap: 9 (ref 5–15)
BUN: 11 mg/dL (ref 6–20)
CO2: 22 mmol/L (ref 22–32)
Calcium: 8.5 mg/dL — ABNORMAL LOW (ref 8.9–10.3)
Chloride: 112 mmol/L — ABNORMAL HIGH (ref 98–111)
Creatinine, Ser: 2.05 mg/dL — ABNORMAL HIGH (ref 0.61–1.24)
GFR, Estimated: 41 mL/min — ABNORMAL LOW (ref >60)
Glucose, Bld: 96 mg/dL (ref 70–99)
Potassium: 4 mmol/L (ref 3.5–5.1)
Sodium: 143 mmol/L (ref 135–145)
Total Bilirubin: 1.1 mg/dL (ref 0.3–1.2)
Total Protein: 6.9 g/dL (ref 6.5–8.1)

## 2021-12-25 LAB — ETHANOL: Alcohol, Ethyl (B): 45 mg/dL — ABNORMAL HIGH (ref <10)

## 2021-12-25 LAB — PROTIME-INR
INR: 1 (ref 0.8–1.2)
Prothrombin Time: 13.1 seconds (ref 11.4–15.2)

## 2021-12-25 LAB — RESP PANEL BY RT-PCR (FLU A&B, COVID) ARPGX2
Influenza A by PCR: NEGATIVE
Influenza B by PCR: NEGATIVE
SARS Coronavirus 2 by RT PCR: NEGATIVE

## 2021-12-25 MED ORDER — LIDOCAINE-EPINEPHRINE (PF) 2 %-1:200000 IJ SOLN
20.0000 mL | Freq: Once | INTRAMUSCULAR | Status: AC
  Administered 2021-12-25: 20 mL via INTRADERMAL
  Filled 2021-12-25: qty 20

## 2021-12-25 MED ORDER — TETANUS-DIPHTH-ACELL PERTUSSIS 5-2.5-18.5 LF-MCG/0.5 IM SUSY
0.5000 mL | PREFILLED_SYRINGE | Freq: Once | INTRAMUSCULAR | Status: AC
  Administered 2021-12-25: 17:00:00 0.5 mL via INTRAMUSCULAR
  Filled 2021-12-25: qty 0.5

## 2021-12-25 MED ORDER — LACTATED RINGERS IV BOLUS
1000.0000 mL | Freq: Once | INTRAVENOUS | Status: AC
  Administered 2021-12-25: 19:00:00 1000 mL via INTRAVENOUS

## 2021-12-25 MED ORDER — GADOBUTROL 1 MMOL/ML IV SOLN
10.0000 mL | Freq: Once | INTRAVENOUS | Status: AC | PRN
  Administered 2021-12-25: 22:00:00 10 mL via INTRAVENOUS

## 2021-12-25 NOTE — Discharge Instructions (Signed)
Follow-up with your primary care doctor or any urgent care to have your wound checked and sutures removed in 5 days.
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
## 2021-12-25 NOTE — ED Notes (Signed)
Pt verbalized understanding of d/c instructions, meds, and followup care. Denies questions. VSS, no distress noted. Steady gait to exit with all belongings.  ?

## 2023-02-09 ENCOUNTER — Telehealth: Payer: BC Managed Care – PPO | Admitting: Physician Assistant

## 2023-02-09 DIAGNOSIS — U071 COVID-19: Secondary | ICD-10-CM

## 2023-02-09 MED ORDER — ALBUTEROL SULFATE HFA 108 (90 BASE) MCG/ACT IN AERS: 2.0000 | INHALATION_SPRAY | Freq: Four times a day (QID) | RESPIRATORY_TRACT | 0 refills | Status: DC | PRN

## 2023-02-09 MED ORDER — BENZONATATE 100 MG PO CAPS: 100.0000 mg | ORAL_CAPSULE | Freq: Three times a day (TID) | ORAL | 0 refills | Status: DC | PRN

## 2023-02-09 MED ORDER — MOLNUPIRAVIR EUA 200MG CAPSULE: 4.0000 | ORAL_CAPSULE | Freq: Two times a day (BID) | ORAL | 0 refills | Status: AC

## 2023-02-09 NOTE — Progress Notes (Signed)
Virtual Visit Consent   Christopher Rice, you are scheduled for a virtual visit with a [facility] provider today. Just as with appointments in the office, your consent must be obtained to participate. Your consent will be active for this visit and any virtual visit you may have with one of our providers in the next 365 days. If you have a MyChart account, a copy of this consent can be sent to you electronically.  As this is a virtual visit, video technology does not allow for your provider to perform a traditional examination. This may limit your provider's ability to fully assess your condition. If your provider identifies any concerns that need to be evaluated in person or the need to arrange testing (such as labs, EKG, etc.), we will make arrangements to do so. Although advances in technology are sophisticated, we cannot ensure that it will always work on either your end or our end. If the connection with a video visit is poor, the visit may have to be switched to a telephone visit. With either a video or telephone visit, we are not always able to ensure that we have a secure connection.  By engaging in this virtual visit, you consent to the provision of healthcare and authorize for your insurance to be billed (if applicable) for the services provided during this visit. Depending on your insurance coverage, you may receive a charge related to this service.  I need to obtain your verbal consent now. Are you willing to proceed with your visit today? Christopher Rice has provided verbal consent on 02/09/2023 for a virtual visit (video or telephone). Leeanne Rio, [place]  Date: 02/09/2023 12:55 PM  Virtual Visit via Video Note   I, Leeanne Rio, connected with  Christopher Rice  ([id number], April 20, 1981) on 02/09/23 at  1:00 PM EST by a video-enabled telemedicine application and verified that I am speaking with the correct person using two identifiers.  Location: Patient:  Virtual Visit Location Patient: Home Provider: Virtual Visit Location Provider: Home Office   I discussed the limitations of evaluation and management by telemedicine and the availability of in person appointments. The patient expressed understanding and agreed to proceed.    History of Present Illness: Christopher Rice is a 42 y.o. who identifies as a male who was assigned male at birth, and is being seen today for symptoms starting Monday and worsening yesterday. Notes nasal congestion, sore throat, body aches. Denies chest pain. Notes SOB with exertion only. Denies GI symptoms. Took a home COVID test that was positive.   OTC -- OTC cough and cold medications.   HPI: HPI  Problems: There are no problems to display for this patient.   Allergies: No Known Allergies Medications: No current outpatient medications on file.  Observations/Objective: Patient is well-developed, well-nourished in no acute distress.  Resting comfortably at home.  Head is normocephalic, atraumatic.  No labored breathing. Speech is clear and coherent with logical content.  Patient is alert and oriented at baseline.   Assessment and Plan: 1. COVID-19  Patient with multiple risk factors for complicated course of illness. Discussed risks/benefits of antiviral medications including most common potential ADRs. Patient voiced understanding and would like to proceed with antiviral medication. They are candidate for Molnupiravir. Rx sent to pharmacy. Supportive measures, OTC medications and vitamin regimen reviewed. Tessalon and Albuterol per orders. Quarantine reviewed in detail. Strict ER precautions discussed with patient.    Follow Up Instructions: I discussed the assessment and treatment  plan with the patient. The patient was provided an opportunity to ask questions and all were answered. The patient agreed with the plan and demonstrated an understanding of the instructions.  A copy of instructions were sent to  the patient via MyChart unless otherwise noted below.   The patient was advised to call back or seek an in-person evaluation if the symptoms worsen or if the condition fails to improve as anticipated.  Time:  I spent 10 minutes with the patient via telehealth technology discussing the above problems/concerns.    Leeanne Rio, PA-C

## 2023-02-09 NOTE — Patient Instructions (Addendum)
Christopher Rice, thank you for joining Leeanne Rio, PA-C for today's virtual visit.  While this provider is not your primary care provider (PCP), if your PCP is located in our provider database this encounter information will be shared with them immediately following your visit.   [facility] account gives you access to today's visit and all your visits, tests, and labs performed at [facility] " click here if you don't have a [facility] account or go to [email or web address]  Consent: (Patient) Christopher Rice provided verbal consent for this virtual visit at the beginning of the encounter.  Current Medications: No current outpatient medications on file.   Medications ordered in this encounter:  No orders of the defined types were placed in this encounter.    *If you need refills on other medications prior to your next appointment, please contact your pharmacy*  Follow-Up: Call back or seek an in-person evaluation if the symptoms worsen or if the condition fails to improve as anticipated.  [facility] [phone]  Other Instructions Please keep well-hydrated and get plenty of rest. Start a saline nasal rinse to flush out your nasal passages. You can use plain Mucinex to help thin congestion. If you have a humidifier, running in the bedroom at night. I want you to start OTC vitamin D3 1000 units daily, vitamin C 1000 mg daily, and a zinc supplement. Please take prescribed medications as directed. Please answer these questions daily so we can keep track of how you are doing.  You were to quarantine for 5 days from onset of your symptoms.  After day 5, if you have had no fever and you are feeling better, you can end quarantine but need to mask for an additional 5 days. After day 5 if you have a fever or are having significant symptoms, please quarantine for full 10 days.  If you note any worsening of symptoms, any  significant shortness of breath or any chest pain, please seek ER evaluation ASAP.  Please do not delay care!  COVID-19: What to Do if You Are Sick If you test positive and are an older adult or someone who is at high risk of getting very sick from COVID-19, treatment may be available. Contact a healthcare provider right away after a positive test to determine if you are eligible, even if your symptoms are mild right now. You can also visit a Test to Treat location and, if eligible, receive a prescription from a provider. Don't delay: Treatment must be started within the first few days to be effective. If you have a fever, cough, or other symptoms, you might have COVID-19. Most people have mild illness and are able to recover at home. If you are sick: Keep track of your symptoms. If you have an emergency warning sign (including trouble breathing), call 911. Steps to help prevent the spread of COVID-19 if you are sick If you are sick with COVID-19 or think you might have COVID-19, follow the steps below to care for yourself and to help protect other people in your home and community. Stay home except to get medical care Stay home. Most people with COVID-19 have mild illness and can recover at home without medical care. Do not leave your home, except to get medical care. Do not visit public areas and do not go to places where you are unable to wear a mask. Take care of yourself. Get rest and stay hydrated. Take over-the-counter medicines, such  as acetaminophen, to help you feel better. Stay in touch with your doctor. Call before you get medical care. Be sure to get care if you have trouble breathing, or have any other emergency warning signs, or if you think it is an emergency. Avoid public transportation, ride-sharing, or taxis if possible. Get tested If you have symptoms of COVID-19, get tested. While waiting for test results, stay away from others, including staying apart from those living in your  household. Get tested as soon as possible after your symptoms start. Treatments may be available for people with COVID-19 who are at risk for becoming very sick. Don't delay: Treatment must be started early to be effective--some treatments must begin within 5 days of your first symptoms. Contact your healthcare provider right away if your test result is positive to determine if you are eligible. Self-tests are one of several options for testing for the virus that causes COVID-19 and may be more convenient than laboratory-based tests and point-of-care tests. Ask your healthcare provider or your local health department if you need help interpreting your test results. You can visit your state, tribal, local, and territorial health department's website to look for the latest local information on testing sites. Separate yourself from other people As much as possible, stay in a specific room and away from other people and pets in your home. If possible, you should use a separate bathroom. If you need to be around other people or animals in or outside of the home, wear a well-fitting mask. Tell your close contacts that they may have been exposed to COVID-19. An infected person can spread COVID-19 starting 48 hours (or 2 days) before the person has any symptoms or tests positive. By letting your close contacts know they may have been exposed to COVID-19, you are helping to protect everyone. See COVID-19 and Animals if you have questions about pets. If you are diagnosed with COVID-19, someone from the health department may call you. Answer the call to slow the spread. Monitor your symptoms Symptoms of COVID-19 include fever, cough, or other symptoms. Follow care instructions from your healthcare provider and local health department. Your local health authorities may give instructions on checking your symptoms and reporting information. When to seek emergency medical attention Look for emergency warning signs*  for COVID-19. If someone is showing any of these signs, seek emergency medical care immediately: Trouble breathing Persistent pain or pressure in the chest New confusion Inability to wake or stay awake Pale, gray, or blue-colored skin, lips, or nail beds, depending on skin tone *This list is not all possible symptoms. Please call your medical provider for any other symptoms that are severe or concerning to you. Call 911 or call ahead to your local emergency facility: Notify the operator that you are seeking care for someone who has or may have COVID-19. Call ahead before visiting your doctor Call ahead. Many medical visits for routine care are being postponed or done by phone or telemedicine. If you have a medical appointment that cannot be postponed, call your doctor's office, and tell them you have or may have COVID-19. This will help the office protect themselves and other patients. If you are sick, wear a well-fitting mask You should wear a mask if you must be around other people or animals, including pets (even at home). Wear a mask with the best fit, protection, and comfort for you. You don't need to wear the mask if you are alone. If you can't put on a mask (  because of trouble breathing, for example), cover your coughs and sneezes in some other way. Try to stay at least 6 feet away from other people. This will help protect the people around you. Masks should not be placed on young children under age 53 years, anyone who has trouble breathing, or anyone who is not able to remove the mask without help. Cover your coughs and sneezes Cover your mouth and nose with a tissue when you cough or sneeze. Throw away used tissues in a lined trash can. Immediately wash your hands with soap and water for at least 20 seconds. If soap and water are not available, clean your hands with an alcohol-based hand sanitizer that contains at least 60% alcohol. Clean your hands often Wash your hands often with soap  and water for at least 20 seconds. This is especially important after blowing your nose, coughing, or sneezing; going to the bathroom; and before eating or preparing food. Use hand sanitizer if soap and water are not available. Use an alcohol-based hand sanitizer with at least 60% alcohol, covering all surfaces of your hands and rubbing them together until they feel dry. Soap and water are the best option, especially if hands are visibly dirty. Avoid touching your eyes, nose, and mouth with unwashed hands. Handwashing Tips Avoid sharing personal household items Do not share dishes, drinking glasses, cups, eating utensils, towels, or bedding with other people in your home. Wash these items thoroughly after using them with soap and water or put in the dishwasher. Clean surfaces in your home regularly Clean and disinfect high-touch surfaces (for example, doorknobs, tables, handles, light switches, and countertops) in your "sick room" and bathroom. In shared spaces, you should clean and disinfect surfaces and items after each use by the person who is ill. If you are sick and cannot clean, a caregiver or other person should only clean and disinfect the area around you (such as your bedroom and bathroom) on an as needed basis. Your caregiver/other person should wait as long as possible (at least several hours) and wear a mask before entering, cleaning, and disinfecting shared spaces that you use. Clean and disinfect areas that may have blood, stool, or body fluids on them. Use household cleaners and disinfectants. Clean visible dirty surfaces with household cleaners containing soap or detergent. Then, use a household disinfectant. Use a product from [employer] List N: Disinfectants for Coronavirus ([id number]). Be sure to follow the instructions on the label to ensure safe and effective use of the product. Many products recommend keeping the surface wet with a disinfectant for a certain period of time (look at  "contact time" on the product label). You may also need to wear personal protective equipment, such as gloves, depending on the directions on the product label. Immediately after disinfecting, wash your hands with soap and water for 20 seconds. For completed guidance on cleaning and disinfecting your home, visit Complete Disinfection Guidance. Take steps to improve ventilation at home Improve ventilation (air flow) at home to help prevent from spreading COVID-19 to other people in your household. Clear out COVID-19 virus particles in the air by opening windows, using air filters, and turning on fans in your home. Use this interactive tool to learn how to improve air flow in your home. When you can be around others after being sick with COVID-19 Deciding when you can be around others is different for different situations. Find out when you can safely end home isolation. For any additional questions about your care,  contact your healthcare provider or state or local health department. 03/17/2021 Content source: [facility] ([facility]), Division of Viral Diseases This information is not intended to replace advice given to you by your health care provider. Make sure you discuss any questions you have with your health care provider. Document Revised: 04/30/2021 Document Reviewed: 04/30/2021 Elsevier Patient Education  2022 [employer].   If you have been instructed to have an in-person evaluation today at a local Urgent Care facility, please use the link below. It will take you to a list of all of our available [facility], including address, phone number and hours of operation. Please do not delay care.  [facility]  If you or a family member do not have a primary care provider, use the link below to schedule a visit and establish care. When you choose a [facility] primary care physician or advanced practice provider, you gain a  long-term partner in health. Find a Primary Care Provider  Learn more about [facility]'s in-office and virtual care options: [facility]

## 2023-02-20 ENCOUNTER — Telehealth: Payer: BC Managed Care – PPO | Admitting: Nurse Practitioner

## 2023-02-20 DIAGNOSIS — U071 COVID-19: Secondary | ICD-10-CM | POA: Diagnosis not present

## 2023-02-20 NOTE — Progress Notes (Signed)
Virtual Visit Consent   Christopher Rice, you are scheduled for a virtual visit with a [facility] provider today. Just as with appointments in the office, your consent must be obtained to participate. Your consent will be active for this visit and any virtual visit you may have with one of our providers in the next 365 days. If you have a MyChart account, a copy of this consent can be sent to you electronically.  As this is a virtual visit, video technology does not allow for your provider to perform a traditional examination. This may limit your provider's ability to fully assess your condition. If your provider identifies any concerns that need to be evaluated in person or the need to arrange testing (such as labs, EKG, etc.), we will make arrangements to do so. Although advances in technology are sophisticated, we cannot ensure that it will always work on either your end or our end. If the connection with a video visit is poor, the visit may have to be switched to a telephone visit. With either a video or telephone visit, we are not always able to ensure that we have a secure connection.  By engaging in this virtual visit, you consent to the provision of healthcare and authorize for your insurance to be billed (if applicable) for the services provided during this visit. Depending on your insurance coverage, you may receive a charge related to this service.  I need to obtain your verbal consent now. Are you willing to proceed with your visit today? Yes. Valla Leaver has provided verbal consent on 02/20/2023 for a virtual visit (video or telephone). Tish Men, NP  Date: 02/20/2023 3:37 PM  Virtual Visit via Video Note   I, [place], connected with  Christopher Rice  ([id number], April 25, 1981) on 02/20/23 at  3:30 PM EST by a video-enabled telemedicine application and verified that I am speaking with the correct person using two  identifiers.  Location: Patient: Virtual Visit Location Patient: Home Provider: Virtual Visit Location Provider: Home   I discussed the limitations of evaluation and management by telemedicine and the availability of in person appointments. The patient expressed understanding and agreed to proceed.    History of Present Illness: Christopher Rice is a 42 y.o. who identifies as a male who was assigned male at birth, and is being seen today for continued COVID symptoms. He continues to experience SOB, dizziness, and sore throat. He also has a mild cough. He denies fever, chills, or GI symptoms. He has completed the antiviral therapy as of yesterday per his report. He reports the dizziness comes and goes, and worsens when he moves too fast or too much. He reports good relief with the albuterol inhaler. He is drinking 4-5 12oz cups of water daily. He reports he took his last covid test 2 days ago, and it continued to read positive.   HPI: HPI  Problems: There are no problems to display for this patient.   Allergies: No Known Allergies Medications:  Current Outpatient Medications:    albuterol (VENTOLIN HFA) 108 (90 Base) MCG/ACT inhaler, Inhale 2 puffs into the lungs every 6 (six) hours as needed for wheezing or shortness of breath., Disp: 8 g, Rfl: 0   azithromycin (ZITHROMAX) 250 MG tablet, Take 1 tablet (250 mg total) by mouth daily. Take first 2 tablets together, then 1 every day until finished., Disp: 6 tablet, Rfl: 0   benzonatate (TESSALON) 100 MG capsule, Take 1 capsule (100 mg  total) by mouth 3 (three) times daily as needed for cough., Disp: 30 capsule, Rfl: 0   cyclobenzaprine (FLEXERIL) 10 MG tablet, Take 1 tablet (10 mg total) by mouth 2 (two) times daily as needed for muscle spasms., Disp: 20 tablet, Rfl: 0   famotidine (PEPCID) 20 MG tablet, Take 1 tablet (20 mg total) by mouth 2 (two) times daily., Disp: 30 tablet, Rfl: 0   ibuprofen (ADVIL) 800 MG tablet, Take 1 tablet (800 mg  total) by mouth 3 (three) times daily., Disp: 21 tablet, Rfl: 0   naproxen (NAPROSYN) 500 MG tablet, Take 1 tablet (500 mg total) by mouth 2 (two) times daily., Disp: 30 tablet, Rfl: 0   orphenadrine (NORFLEX) 100 MG tablet, Take 1 tablet (100 mg total) by mouth 2 (two) times daily., Disp: 30 tablet, Rfl: 0   oxyCODONE-acetaminophen (PERCOCET) 5-325 MG tablet, Take 1 tablet by mouth every 4 (four) hours as needed for moderate pain., Disp: 10 tablet, Rfl: 0   Phenylephrine-DM-GG-APAP (TYLENOL COLD/FLU SEVERE) 5-10-200-325 MG TABS, Take 1 tablet by mouth daily as needed (cold symptoms)., Disp: , Rfl:    Phenylephrine-Pheniramine-DM (THERAFLU COLD & COUGH PO), Take 1 packet by mouth daily as needed (cold symptoms)., Disp: , Rfl:   Observations/Objective: Patient is well-developed, well-nourished in no acute distress.  Resting comfortably at home.  Head is normocephalic, atraumatic.  No labored breathing.  Speech is clear and coherent with logical content.  Patient is alert and oriented at baseline.   Assessment and Plan: 1. COVID  Patient continues to report cough, SOB and dizziness. Reports relief of SOB with use of albuterol inhaler. Patient has reached 10 days of symptoms at this time. Patient was advised to consider retesting on 02/21/23. Cannot rule out pneumonia or lower respiratory infection post COVID. Advised if symptoms fail to improve, recommended in person visit for further evaluation. Continue current medications at this time. Patient is in agreement with this plan of care and verbalizes understanding, all questions were answered. Note was provided for work.   Follow Up Instructions: I discussed the assessment and treatment plan with the patient. The patient was provided an opportunity to ask questions and all were answered. The patient agreed with the plan and demonstrated an understanding of the instructions.  A copy of instructions were sent to the patient via MyChart unless  otherwise noted below.   The patient was advised to call back or seek an in-person evaluation if the symptoms worsen or if the condition fails to improve as anticipated.  Time:  I spent 10 minutes with the patient via telehealth technology discussing the above problems/concerns.    Tish Men, NP

## 2023-02-20 NOTE — Patient Instructions (Signed)
Christopher Rice, thank you for joining Tish Men, NP for today's virtual visit.  While this provider is not your primary care provider (PCP), if your PCP is located in our provider database this encounter information will be shared with them immediately following your visit.   [facility] account gives you access to today's visit and all your visits, tests, and labs performed at [facility] " click here if you don't have a [facility] account or go to [email or web address]  Consent: (Patient) Christopher Rice provided verbal consent for this virtual visit at the beginning of the encounter.  Current Medications:  Current Outpatient Medications:    albuterol (VENTOLIN HFA) 108 (90 Base) MCG/ACT inhaler, Inhale 2 puffs into the lungs every 6 (six) hours as needed for wheezing or shortness of breath., Disp: 8 g, Rfl: 0   azithromycin (ZITHROMAX) 250 MG tablet, Take 1 tablet (250 mg total) by mouth daily. Take first 2 tablets together, then 1 every day until finished., Disp: 6 tablet, Rfl: 0   benzonatate (TESSALON) 100 MG capsule, Take 1 capsule (100 mg total) by mouth 3 (three) times daily as needed for cough., Disp: 30 capsule, Rfl: 0   cyclobenzaprine (FLEXERIL) 10 MG tablet, Take 1 tablet (10 mg total) by mouth 2 (two) times daily as needed for muscle spasms., Disp: 20 tablet, Rfl: 0   famotidine (PEPCID) 20 MG tablet, Take 1 tablet (20 mg total) by mouth 2 (two) times daily., Disp: 30 tablet, Rfl: 0   ibuprofen (ADVIL) 800 MG tablet, Take 1 tablet (800 mg total) by mouth 3 (three) times daily., Disp: 21 tablet, Rfl: 0   naproxen (NAPROSYN) 500 MG tablet, Take 1 tablet (500 mg total) by mouth 2 (two) times daily., Disp: 30 tablet, Rfl: 0   orphenadrine (NORFLEX) 100 MG tablet, Take 1 tablet (100 mg total) by mouth 2 (two) times daily., Disp: 30 tablet, Rfl: 0   oxyCODONE-acetaminophen (PERCOCET) 5-325 MG tablet, Take 1 tablet by mouth  every 4 (four) hours as needed for moderate pain., Disp: 10 tablet, Rfl: 0   Phenylephrine-DM-GG-APAP (TYLENOL COLD/FLU SEVERE) 5-10-200-325 MG TABS, Take 1 tablet by mouth daily as needed (cold symptoms)., Disp: , Rfl:    Phenylephrine-Pheniramine-DM (THERAFLU COLD & COUGH PO), Take 1 packet by mouth daily as needed (cold symptoms)., Disp: , Rfl:    Medications ordered in this encounter:  No orders of the defined types were placed in this encounter.    *If you need refills on other medications prior to your next appointment, please contact your pharmacy*  Follow-Up: Call back or seek an in-person evaluation if the symptoms worsen or if the condition fails to improve as anticipated.  [facility] [phone]  Other Instructions Continue current medication at this time. If symptoms continue to persist without improvement such as shortness of breath or wheezing, recommend in person visit for further evaluation. Consider retest on 02/21/23. Go to the emergency department for worsening shortness of breath, difficulty breathing or other concerns.    If you have been instructed to have an in-person evaluation today at a local Urgent Care facility, please use the link below. It will take you to a list of all of our available [facility], including address, phone number and hours of operation. Please do not delay care.  [facility]  If you or a family member do not have a primary care provider, use the link below to schedule a  visit and establish care. When you choose a [facility] primary care physician or advanced practice provider, you gain a long-term partner in health. Find a Primary Care Provider  Learn more about [facility]'s in-office and virtual care options: [facility]

## 2023-04-22 ENCOUNTER — Telehealth: Payer: BC Managed Care – PPO

## 2023-04-22 ENCOUNTER — Telehealth: Payer: BC Managed Care – PPO | Admitting: Family Medicine

## 2023-04-22 DIAGNOSIS — J301 Allergic rhinitis due to pollen: Secondary | ICD-10-CM | POA: Diagnosis not present

## 2023-04-22 MED ORDER — PREDNISONE 20 MG PO TABS: 20.0000 mg | ORAL_TABLET | Freq: Two times a day (BID) | ORAL | 0 refills | Status: AC

## 2023-04-22 NOTE — Patient Instructions (Signed)

## 2023-04-22 NOTE — Progress Notes (Signed)
Virtual Visit Consent   Joshau Rice, you are scheduled for a virtual visit with a [facility] provider today. Just as with appointments in the office, your consent must be obtained to participate. Your consent will be active for this visit and any virtual visit you may have with one of our providers in the next 365 days. If you have a MyChart account, a copy of this consent can be sent to you electronically.  As this is a virtual visit, video technology does not allow for your provider to perform a traditional examination. This may limit your provider's ability to fully assess your condition. If your provider identifies any concerns that need to be evaluated in person or the need to arrange testing (such as labs, EKG, etc.), we will make arrangements to do so. Although advances in technology are sophisticated, we cannot ensure that it will always work on either your end or our end. If the connection with a video visit is poor, the visit may have to be switched to a telephone visit. With either a video or telephone visit, we are not always able to ensure that we have a secure connection.  By engaging in this virtual visit, you consent to the provision of healthcare and authorize for your insurance to be billed (if applicable) for the services provided during this visit. Depending on your insurance coverage, you may receive a charge related to this service.  I need to obtain your verbal consent now. Are you willing to proceed with your visit today? [name] has provided verbal consent on 04/22/2023 for a virtual visit (video or telephone). Georgana Curio, FNP  Date: 04/22/2023 2:48 PM  Virtual Visit via Video Note   I, Georgana Curio, connected with  Christopher Rice  ([id number], 09-16-1981) on 04/22/23 at  2:45 PM EDT by a video-enabled telemedicine application and verified that I am speaking with the correct person using two identifiers.  Location: Patient: Virtual Visit  Location Patient: Home Provider: Virtual Visit Location Provider: Home Office   I discussed the limitations of evaluation and management by telemedicine and the availability of in person appointments. The patient expressed understanding and agreed to proceed.    History of Present Illness: Christopher Rice is a 42 y.o. who identifies as a male who was assigned male at birth, and is being seen today for dizziness and allergies. He says he has bad allergies with runny nose, sneezing, itching eyes. Minimal cough. 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 [name]  HPI: HPI  Problems: There are no problems to display for this patient.   Allergies: No Known Allergies Medications:  Current Outpatient Medications:    predniSONE (DELTASONE) 20 MG tablet, Take 1 tablet (20 mg total) by mouth 2 (two) times daily with a meal for 5 days., Disp: 10 tablet, Rfl: 0   albuterol (VENTOLIN HFA) 108 (90 Base) MCG/ACT inhaler, Inhale 2 puffs into the lungs every 6 (six) hours as needed for wheezing or shortness of breath., Disp: 8 g, Rfl: 0   azithromycin (ZITHROMAX) 250 MG tablet, Take 1 tablet (250 mg total) by mouth daily. Take first 2 tablets together, then 1 every day until finished., Disp: 6 tablet, Rfl: 0   benzonatate (TESSALON) 100 MG capsule, Take 1 capsule (100 mg total) by mouth 3 (three) times daily as needed for cough., Disp: 30 capsule, Rfl: 0   cyclobenzaprine (FLEXERIL) 10 MG tablet, Take  1 tablet (10 mg total) by mouth 2 (two) times daily as needed for muscle spasms., Disp: 20 tablet, Rfl: 0   famotidine (PEPCID) 20 MG tablet, Take 1 tablet (20 mg total) by mouth 2 (two) times daily., Disp: 30 tablet, Rfl: 0   ibuprofen (ADVIL) 800 MG tablet, Take 1 tablet (800 mg total) by mouth 3 (three) times daily., Disp: 21 tablet, Rfl: 0   naproxen (NAPROSYN) 500 MG tablet, Take 1 tablet (500 mg total) by mouth 2 (two) times daily., Disp: 30 tablet, Rfl: 0    orphenadrine (NORFLEX) 100 MG tablet, Take 1 tablet (100 mg total) by mouth 2 (two) times daily., Disp: 30 tablet, Rfl: 0   oxyCODONE-acetaminophen (PERCOCET) 5-325 MG tablet, Take 1 tablet by mouth every 4 (four) hours as needed for moderate pain., Disp: 10 tablet, Rfl: 0   Phenylephrine-DM-GG-APAP (TYLENOL COLD/FLU SEVERE) 5-10-200-325 MG TABS, Take 1 tablet by mouth daily as needed (cold symptoms)., Disp: , Rfl:    Phenylephrine-Pheniramine-DM (THERAFLU COLD & COUGH PO), Take 1 packet by mouth daily as needed (cold symptoms)., Disp: , Rfl:   Observations/Objective: Patient is well-developed, well-nourished in no acute distress.  Resting comfortably  at home.  Head is normocephalic, atraumatic.  No labored breathing.  Speech is clear and coherent with logical content.  Patient is alert and oriented at baseline.    Assessment and Plan: 1. Seasonal allergic rhinitis due to pollen  Start allegra and flonase otc, med use and side effects per pharmacy, UC if sx worsen. He also reports neg covid test at home.   Follow Up Instructions: I discussed the assessment and treatment plan with the patient. The patient was provided an opportunity to ask questions and all were answered. The patient agreed with the plan and demonstrated an understanding of the instructions.  A copy of instructions were sent to the patient via MyChart unless otherwise noted below.     The patient was advised to call back or seek an in-person evaluation if the symptoms worsen or if the condition fails to improve as anticipated.  Time:  I spent 10 minutes with the patient via telehealth technology discussing the above problems/concerns.    Georgana Curio, FNP

## 2023-04-23 ENCOUNTER — Telehealth: Payer: BC Managed Care – PPO | Admitting: Urgent Care

## 2023-04-23 DIAGNOSIS — R0981 Nasal congestion: Secondary | ICD-10-CM

## 2023-04-23 DIAGNOSIS — H6993 Unspecified Eustachian tube disorder, bilateral: Secondary | ICD-10-CM | POA: Diagnosis not present

## 2023-04-23 DIAGNOSIS — R42 Dizziness and giddiness: Secondary | ICD-10-CM

## 2023-04-23 MED ORDER — MECLIZINE HCL 25 MG PO TABS: 25.0000 mg | ORAL_TABLET | Freq: Three times a day (TID) | ORAL | 0 refills | Status: AC | PRN

## 2023-04-23 MED ORDER — FLUTICASONE PROPIONATE 50 MCG/ACT NA SUSP: 2.0000 | Freq: Every day | NASAL | 0 refills | Status: AC

## 2023-04-23 NOTE — Progress Notes (Signed)
Virtual Visit Consent   Christopher Rice, you are scheduled for a virtual visit with a [facility] provider today. Just as with appointments in the office, your consent must be obtained to participate. Your consent will be active for this visit and any virtual visit you may have with one of our providers in the next 365 days. If you have a MyChart account, a copy of this consent can be sent to you electronically.  As this is a virtual visit, video technology does not allow for your provider to perform a traditional examination. This may limit your provider's ability to fully assess your condition. If your provider identifies any concerns that need to be evaluated in person or the need to arrange testing (such as labs, EKG, etc.), we will make arrangements to do so. Although advances in technology are sophisticated, we cannot ensure that it will always work on either your end or our end. If the connection with a video visit is poor, the visit may have to be switched to a telephone visit. With either a video or telephone visit, we are not always able to ensure that we have a secure connection.  By engaging in this virtual visit, you consent to the provision of healthcare and authorize for your insurance to be billed (if applicable) for the services provided during this visit. Depending on your insurance coverage, you may receive a charge related to this service.  I need to obtain your verbal consent now. Are you willing to proceed with your visit today? [name] has provided verbal consent on 04/23/2023 for a virtual visit (video or telephone). Maretta Bees, PA  Date: 04/23/2023 3:38 PM  Virtual Visit via Video Note   I, Christopher Rice L Mao Lockner, connected with  Christopher Rice  ([id number], 06-30-1981) on 04/23/23 at  3:00 PM EDT by a video-enabled telemedicine application and verified that I am speaking with the correct person using two identifiers.  Location: Patient: Virtual Visit  Location Patient: Home Provider: Virtual Visit Location Provider: Home Office   I discussed the limitations of evaluation and management by telemedicine and the availability of in person appointments. The patient expressed understanding and agreed to proceed.    History of Present Illness: Christopher Rice is a 42 y.o. who identifies as a male who was assigned male at birth, and is being seen today for sinus congestion, sneezing and dizziness.  HPI: 42yo male with non-contributory past medical history presents today with concern primarily for ongoing dizziness. He was seen yesterday at sx onset and prescribed 40mg  prednisone daily for 5 days to help with possible allergy sx. He states he filled the prednisone yesterday afternoon and took 20mg  last night and 20mg  this morning, with no improvement to symptoms. He states he has an intermittent ringing in his ears. He reports frontal sinus pressure but no pain. He states that rapid movement of his head makes the dizziness worse. He denies the sensation of feeling faint or light-headed. He does smoke, denies hx of blood clots or CVA and denies any current calf pain, edema or SOB/CP. He takes no Rx medications and is not taking any OTC medications currently. He did not fill the flonase recommended yesterday, but states it has helped in the past. He denies any slurred speech, weakness of extremities, gait disturbance, severe HA, blurred vision or any additional neurological sx. He believes his sx are related to inner ear fluid/ allergies as they intermittently ring and pop. Pt denies any additional  c/o today. Requesting extension on his work note.   Problems: There are no problems to display for this patient.   Allergies: No Known Allergies Medications:  Current Outpatient Medications:    fluticasone (FLONASE) 50 MCG/ACT nasal spray, Place 2 sprays into both nostrils daily., Disp: 16 g, Rfl: 0   meclizine (ANTIVERT) 25 MG tablet, Take 1 tablet (25 mg  total) by mouth 3 (three) times daily as needed for dizziness., Disp: 30 tablet, Rfl: 0   predniSONE (DELTASONE) 20 MG tablet, Take 1 tablet (20 mg total) by mouth 2 (two) times daily with a meal for 5 days., Disp: 10 tablet, Rfl: 0  Observations/Objective: Patient is well-developed, well-nourished in no acute distress.  Resting comfortably at home.  Head is normocephalic, atraumatic.  No labored breathing. No coughing. Speech is clear and coherent with logical content.  Patient is alert and oriented at baseline.  CN II-XII intact. FROM noted to BUE Normal speech, normal coordination. Some sniffling during exam  Assessment and Plan: 1. Dizziness  2. Sinus congestion  3. Eustachian tube dysfunction, bilateral  We discussed in depth the limitations to video visits for the complaint of dizziness. He is not currently describing any red flag s/sx and had a normal limited neurological exam. I think it is appropriate to treat him for BPPV given intermittent tinnitus in conjunction with worsening sx with rapid head movements. Pt can continue the 20mg  BID prednisone; I will call in the flonase to help with suspected ETD. I encouraged pt to take meclizine three times daily if dizziness persists, however if no sx improvement noted with todays treatment, then I strongly encouraged pt to present to UC or ER for comprehensive workup.  Follow Up Instructions: I discussed the assessment and treatment plan with the patient. The patient was provided an opportunity to ask questions and all were answered. The patient agreed with the plan and demonstrated an understanding of the instructions.  A copy of instructions were sent to the patient via MyChart unless otherwise noted below.    The patient was advised to call back or seek an in-person evaluation if the symptoms worsen or if the condition fails to improve as anticipated.  Time:  I spent 12 minutes with the patient via telehealth technology discussing  the above problems/concerns.    Cerissa Zeiger L Francheska Villeda, PA

## 2023-04-23 NOTE — Patient Instructions (Signed)
  Christopher Rice, thank you for joining Maretta Bees, PA for today's virtual visit.  While this provider is not your primary care provider (PCP), if your PCP is located in our provider database this encounter information will be shared with them immediately following your visit.   A [facility] MyChart account gives you access to today's visit and all your visits, tests, and labs performed at [facility] " click here if you don't have a [facility] MyChart account or go to [email or web address]  Consent: (Patient) Christopher Rice provided verbal consent for this virtual visit at the beginning of the encounter.  Current Medications:  Current Outpatient Medications:    fluticasone (FLONASE) 50 MCG/ACT nasal spray, Place 2 sprays into both nostrils daily., Disp: 16 g, Rfl: 0   meclizine (ANTIVERT) 25 MG tablet, Take 1 tablet (25 mg total) by mouth 3 (three) times daily as needed for dizziness., Disp: 30 tablet, Rfl: 0   predniSONE (DELTASONE) 20 MG tablet, Take 1 tablet (20 mg total) by mouth 2 (two) times daily with a meal for 5 days., Disp: 10 tablet, Rfl: 0   Medications ordered in this encounter:  Meds ordered this encounter  Medications   meclizine (ANTIVERT) 25 MG tablet    Sig: Take 1 tablet (25 mg total) by mouth 3 (three) times daily as needed for dizziness.    Dispense:  30 tablet    Refill:  0    Order Specific Question:   Supervising Provider    Answer:   Merrilee Jansky [[id number]]   fluticasone (FLONASE) 50 MCG/ACT nasal spray    Sig: Place 2 sprays into both nostrils daily.    Dispense:  16 g    Refill:  0    Order Specific Question:   Supervising Provider    Answer:   Merrilee Jansky [id number]     *If you need refills on other medications prior to your next appointment, please contact your pharmacy*  Follow-Up: Call back or seek an in-person evaluation if the symptoms worsen or if the condition fails to improve as anticipated.  [facility] [phone]  Other Instructions Please start using flonase, 1-2 sprays to each nostril to help with any allergies or sinus congestion. You may continue taking the 20mg  prednisone twice daily until gone. Please also start taking meclizine every 8 hours as needed for dizziness.  IF YOUR DIZZINESS PERSISTS OVER THE NEXT 24 HOURS DESPITE TREATMENTS OFFERED, PLEASE HEAD TO AN IN PERSON URGENT CARE OR EMERGENCY  ROOM.  If you develop any severe headache, difficulty walking, change in speech or vision, call EMS and head to ER immediately.   If you have been instructed to have an in-person evaluation today at a local Urgent Care facility, please use the link below. It will take you to a list of all of our available [facility], including address, phone number and hours of operation. Please do not delay care.  [facility]  If you or a family member do not have a primary care provider, use the link below to schedule a visit and establish care. When you choose a [facility] primary care physician or advanced practice provider, you gain a long-term partner in health. Find a Primary Care Provider  Learn more about [facility]'s in-office and virtual care options: [facility] - Get Care Now

## 2023-11-12 IMAGING — MR MR HEAD WO/W CM
12 of 14 series · 40 of 48 positions shown · IV contrast (gadavist)
Comparison: Prior CT from earlier the same day.

CLINICAL DATA: Initial evaluation for acute head trauma, focal
neural deficit.

EXAM:
MRI HEAD WITHOUT AND WITH CONTRAST
TECHNIQUE: Multiplanar, multiecho pulse sequences of the brain and surrounding
structures were obtained without and with intravenous contrast.
CONTRAST:  10mL GADAVIST GADOBUTROL 1 MMOL/ML IV SOLN

[Series 5: DWI · axial · 3.0mm · 0.88mm/px · z∈[-94,+52]mm · 8 of 100 slices shown (1 of 4)]
[im 1/100]
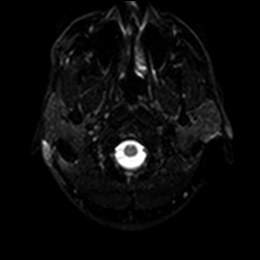
[im 15/100]
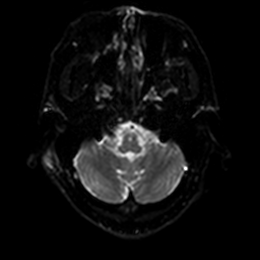
[im 29/100]
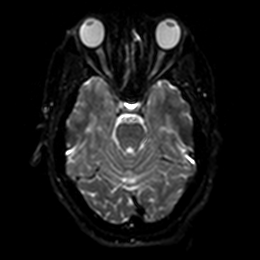
[im 43/100]
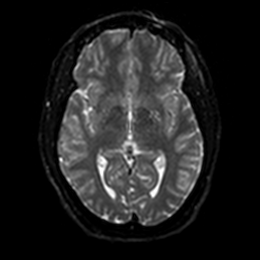
[im 57/100]
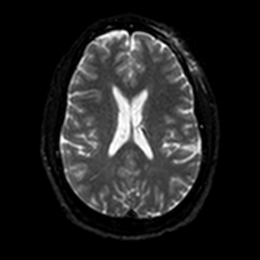
[im 71/100]
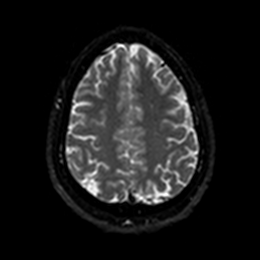
[im 85/100]
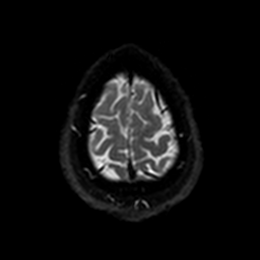
[im 100/100]
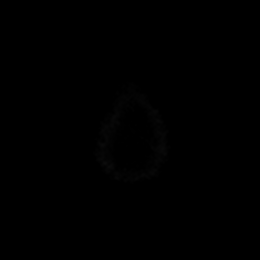

[Series 6: DWI · axial · 3.0mm · 0.88mm/px · z∈[-94,+52]mm · 5 of 50 slices shown (2 of 4)]
[im 1/50]
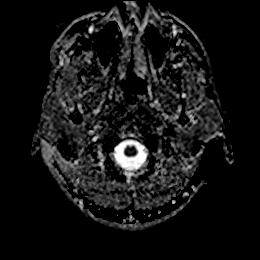
[im 13/50]
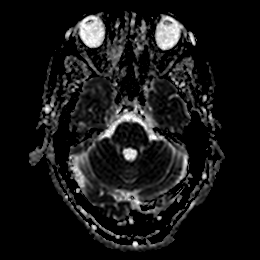
[im 25/50]
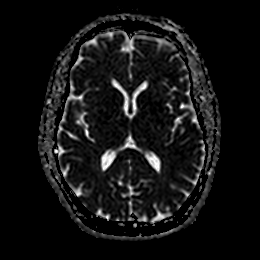
[im 37/50]
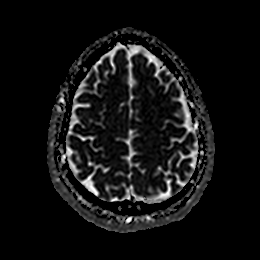
[im 50/50]
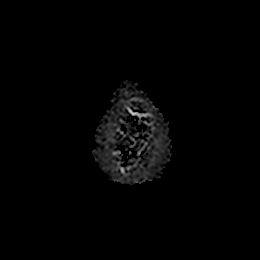

[Series 7: DWI · coronal · 4.0mm · 0.88mm/px · 5 of 66 slices shown (3 of 4)]
[im 1/66]
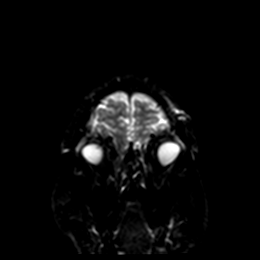
[im 17/66]
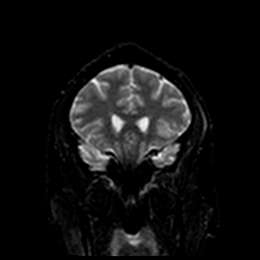
[im 33/66]
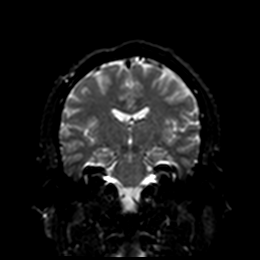
[im 49/66]
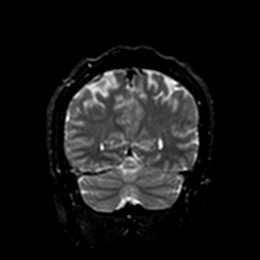
[im 66/66]
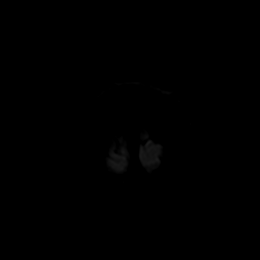

[Series 8: DWI · coronal · 4.0mm · 0.88mm/px · 2 of 33 slices shown (4 of 4)]
[im 1/33]
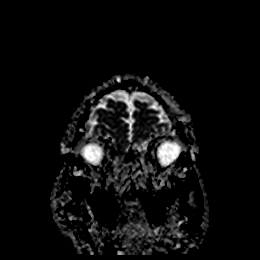
[im 33/33]
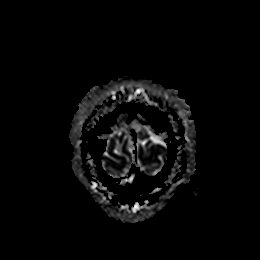

[Series 9: T1 · sagittal · 5.0mm · 0.78mm/px · 2 of 23 slices shown]
[im 1/23]
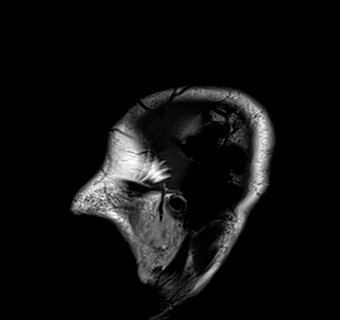
[im 23/23]
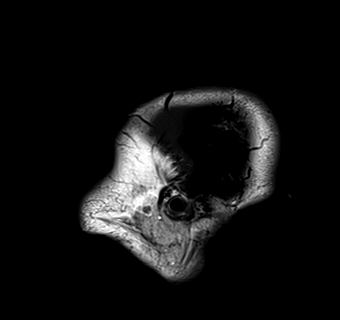

[Series 10: T2 · axial · 5.0mm · 0.72mm/px · z∈[-93,+51]mm · 2 of 25 slices shown]
[im 1/25]
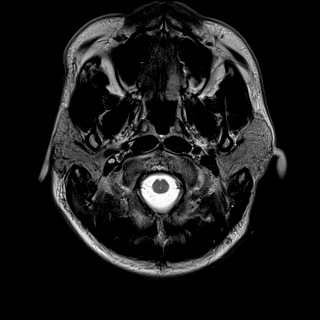
[im 25/25]
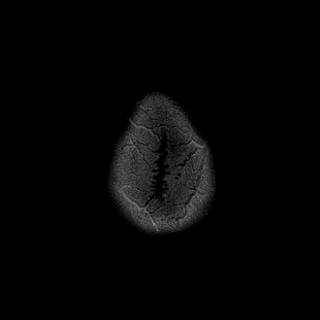

[Series 11: FLAIR · axial · 5.0mm · 0.45mm/px · z∈[-92,+52]mm · 2 of 25 slices shown]
[im 1/25]
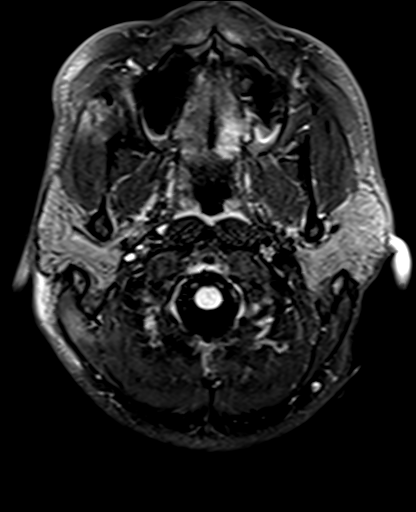
[im 25/25]
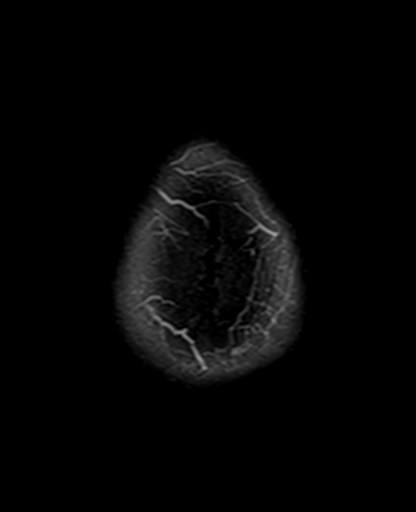

[Series 13: pha_images · axial · 3.0mm · 0.90mm/px · z∈[-109,+68]mm · 4 of 60 slices shown]
[im 1/60]
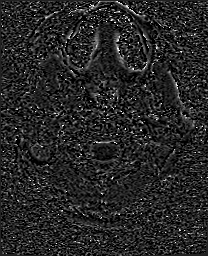
[im 20/60]
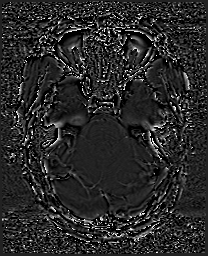
[im 40/60]
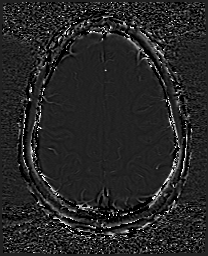
[im 60/60]
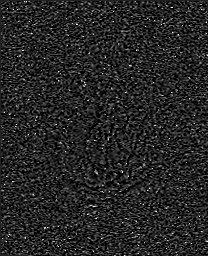

[Series 14: swi_images · axial · 3.0mm · 0.90mm/px · z∈[-109,+68]mm · 4 of 60 slices shown]
[im 1/60]
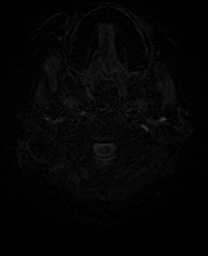
[im 20/60]
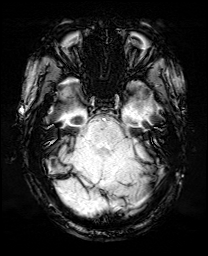
[im 40/60]
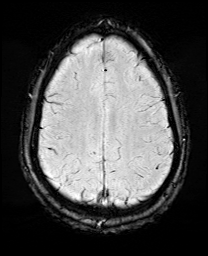
[im 60/60]
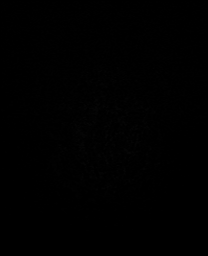

[Series 21: T2 post-contrast · coronal · 5.0mm · 0.72mm/px · 2 of 28 slices shown]
[im 1/28]
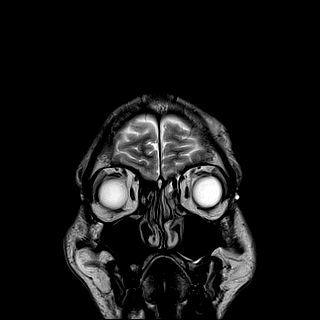
[im 28/28]
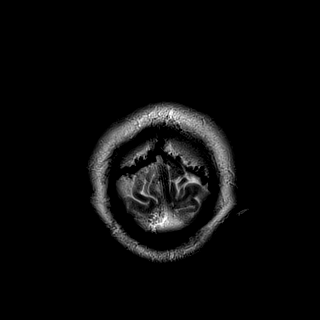

[Series 22: T1 post-contrast · coronal · 5.0mm · 0.34mm/px · 2 of 28 slices shown (1 of 2)]
[im 1/28]
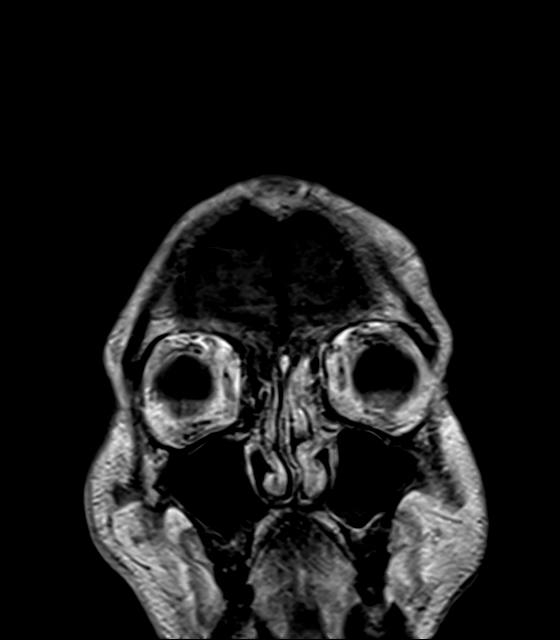
[im 28/28]
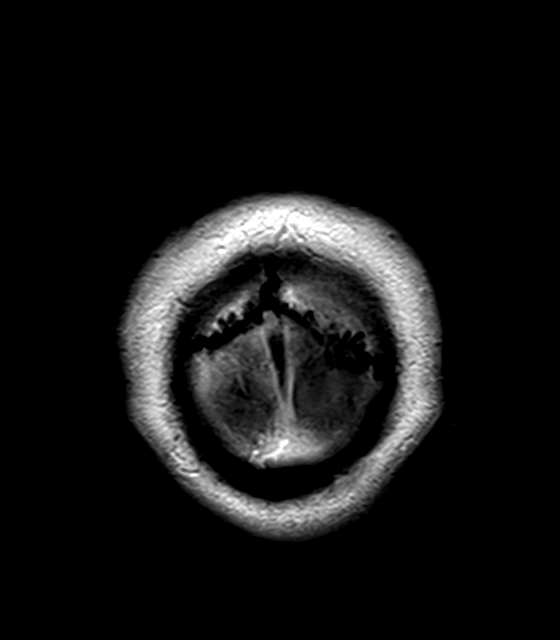

[Series 24: T1 post-contrast · sagittal · 5.0mm · 0.75mm/px · 2 of 23 slices shown (2 of 2)]
[im 1/23]
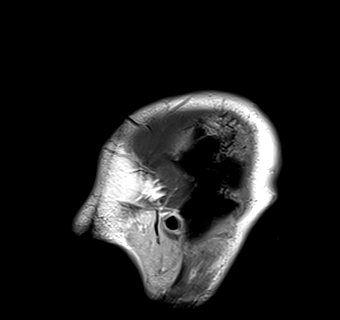
[im 23/23]
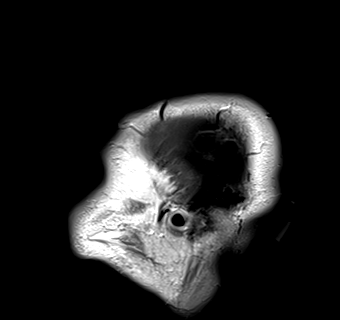

[40 of 48 positions shown; findings below may reference images not displayed]

FINDINGS: Brain: Cerebral volume within normal limits. No focal parenchymal
signal abnormality. No significant cerebral white matter disease for
age.

No abnormal foci of restricted diffusion to suggest acute or
subacute ischemia. Gray-white matter differentiation maintained. No
encephalomalacia to suggest chronic cortical infarction. No evidence
for acute or chronic intracranial hemorrhage.

No mass lesion, midline shift or mass effect. No hydrocephalus or
extra-axial fluid collection. Pituitary gland suprasellar region
within normal limits. Midline structures intact and normal.

No abnormal enhancement.

Vascular: Major intracranial vascular flow voids are maintained.

Skull and upper cervical spine: Craniocervical junction within
normal limits. Bone marrow signal intensity normal. Soft tissue
contusion/laceration present at the left forehead/supraorbital
region.

Sinuses/Orbits: Globes and orbital soft tissues within normal
limits. Scattered mucosal thickening noted throughout the paranasal
sinuses. No air-fluid levels to suggest acute sinusitis. Trace left
mastoid effusion noted, of doubtful significance. Inner ear
structures grossly normal.

Other: None.
IMPRESSION: 1. Negative brain MRI.  No acute intracranial abnormality.
2. Soft tissue contusion/laceration at the left forehead.

## 2024-01-19 ENCOUNTER — Encounter (HOSPITAL_COMMUNITY): Payer: Self-pay | Admitting: Emergency Medicine

## 2024-01-19 ENCOUNTER — Emergency Department (HOSPITAL_COMMUNITY)
Admission: EM | Admit: 2024-01-19 | Discharge: 2024-01-20 | Disposition: A | Discharge status: Home / Self Care | Payer: Self-pay | Attending: Student | Admitting: Student

## 2024-01-19 ENCOUNTER — Other Ambulatory Visit: Payer: Self-pay

## 2024-01-19 DIAGNOSIS — S0181XA Laceration without foreign body of other part of head, initial encounter: Secondary | ICD-10-CM | POA: Insufficient documentation

## 2024-01-19 DIAGNOSIS — W25XXXA Contact with sharp glass, initial encounter: Secondary | ICD-10-CM | POA: Insufficient documentation
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
## 2024-01-20 ENCOUNTER — Emergency Department (HOSPITAL_COMMUNITY): Payer: Self-pay

## 2024-01-20 MED ORDER — LIDOCAINE-EPINEPHRINE (PF) 2 %-1:200000 IJ SOLN
10.0000 mL | Freq: Once | INTRAMUSCULAR | Status: AC
  Administered 2024-01-20: 10 mL via INTRADERMAL
  Filled 2024-01-20: qty 20
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
## 2024-07-27 ENCOUNTER — Emergency Department (HOSPITAL_COMMUNITY)
Admission: EM | Admit: 2024-07-27 | Discharge: 2024-07-28 | Discharge status: Against Medical Advice | Payer: Self-pay | Attending: Emergency Medicine | Admitting: Emergency Medicine

## 2024-07-27 ENCOUNTER — Encounter (HOSPITAL_COMMUNITY): Payer: Self-pay | Admitting: Emergency Medicine

## 2024-07-27 ENCOUNTER — Other Ambulatory Visit: Payer: Self-pay

## 2024-07-27 DIAGNOSIS — Z5321 Procedure and treatment not carried out due to patient leaving prior to being seen by health care provider: Secondary | ICD-10-CM | POA: Insufficient documentation

## 2024-07-27 DIAGNOSIS — M79605 Pain in left leg: Secondary | ICD-10-CM | POA: Insufficient documentation

## 2024-07-27 NOTE — ED Triage Notes (Signed)
 Pt via POV c/o left leg pain after he heard a pop while he was helping someone. Pt is ambulating with a cane due to current pain but normally does not need assistive devices. Pain rated 10/10.

## 2024-07-28 ENCOUNTER — Other Ambulatory Visit (HOSPITAL_COMMUNITY): Payer: Self-pay

## 2024-07-28 ENCOUNTER — Emergency Department (HOSPITAL_COMMUNITY): Payer: Self-pay
